# Patient Record
Sex: Female | Born: 1940 | Race: Black or African American | Hispanic: No | Smoking: Never smoker
Health system: Southern US, Community
[De-identification: ages and names within clinical notes are randomized; demographics above are authoritative.]

## PROBLEM LIST (undated history)

## (undated) DIAGNOSIS — I10 Essential (primary) hypertension: Secondary | ICD-10-CM

## (undated) HISTORY — PX: TOTAL HIP ARTHROPLASTY: SHX124

---

## 2006-11-19 ENCOUNTER — Emergency Department (HOSPITAL_COMMUNITY): Admission: EM | Admit: 2006-11-19 | Discharge: 2006-11-19 | Payer: Self-pay | Admitting: Emergency Medicine

## 2011-04-21 ENCOUNTER — Emergency Department (HOSPITAL_COMMUNITY): Payer: Medicare Other

## 2011-04-21 ENCOUNTER — Emergency Department (HOSPITAL_COMMUNITY)
Admission: EM | Admit: 2011-04-21 | Discharge: 2011-04-21 | Disposition: A | Discharge status: Home / Self Care | Payer: Medicare Other | Attending: Emergency Medicine | Admitting: Emergency Medicine

## 2011-04-21 DIAGNOSIS — M549 Dorsalgia, unspecified: Secondary | ICD-10-CM | POA: Insufficient documentation

## 2011-04-21 DIAGNOSIS — J209 Acute bronchitis, unspecified: Secondary | ICD-10-CM | POA: Insufficient documentation

## 2011-04-21 DIAGNOSIS — R059 Cough, unspecified: Secondary | ICD-10-CM | POA: Insufficient documentation

## 2011-04-21 DIAGNOSIS — R05 Cough: Secondary | ICD-10-CM | POA: Insufficient documentation

## 2011-04-21 DIAGNOSIS — I10 Essential (primary) hypertension: Secondary | ICD-10-CM | POA: Insufficient documentation

## 2011-04-21 LAB — BASIC METABOLIC PANEL
BUN: 16 mg/dL (ref 6–23)
CO2: 32 mEq/L (ref 19–32)
Chloride: 96 mEq/L (ref 96–112)
Creatinine, Ser: 1.44 mg/dL — ABNORMAL HIGH (ref 0.4–1.2)
GFR calc Af Amer: 43 mL/min — ABNORMAL LOW (ref >60)
GFR calc non Af Amer: 36 mL/min — ABNORMAL LOW (ref >60)
Potassium: 4.1 mEq/L (ref 3.5–5.1)

## 2011-04-21 LAB — POCT CARDIAC MARKERS: Myoglobin, poc: 272 ng/mL (ref 12–200)

## 2011-04-21 LAB — CBC
HCT: 39.9 % (ref 36.0–46.0)
MCH: 27.5 pg (ref 26.0–34.0)
WBC: 3.5 10*3/uL — ABNORMAL LOW (ref 4.0–10.5)

## 2011-04-21 LAB — DIFFERENTIAL
Basophils Absolute: 0 10*3/uL (ref 0.0–0.1)
Basophils Relative: 0 % (ref 0–1)
Eosinophils Relative: 1 % (ref 0–5)
Lymphs Abs: 0.7 10*3/uL (ref 0.7–4.0)
Monocytes Absolute: 0.4 10*3/uL (ref 0.1–1.0)
Monocytes Relative: 10 % (ref 3–12)
Neutro Abs: 2.3 10*3/uL (ref 1.7–7.7)

## 2012-01-10 IMAGING — CR DG CHEST 2V
2 series · 2 of 2 positions shown · non-contrast
Comparison: 11/19/2006

CLINICAL DATA: Cough, chest pain, left arm pain.

CHEST - 2 VIEW

[view not recorded (1 of 2)]
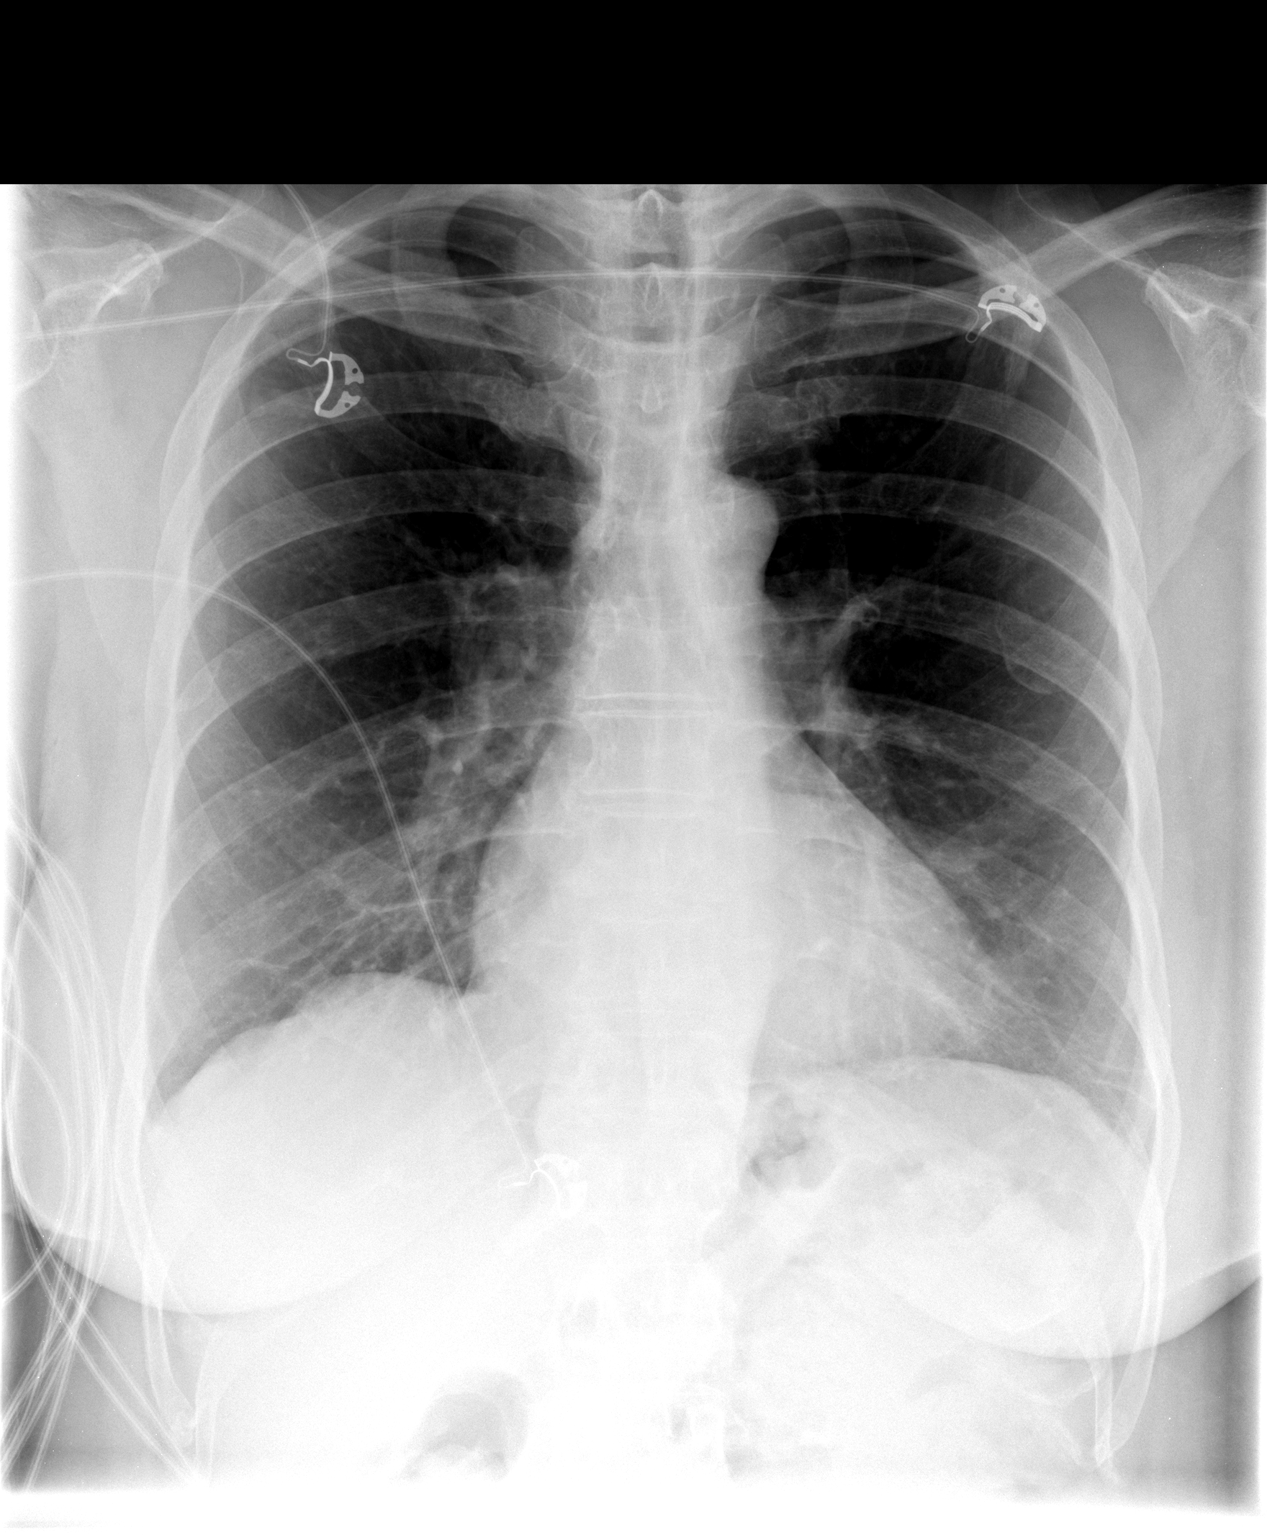

[view not recorded (2 of 2)]
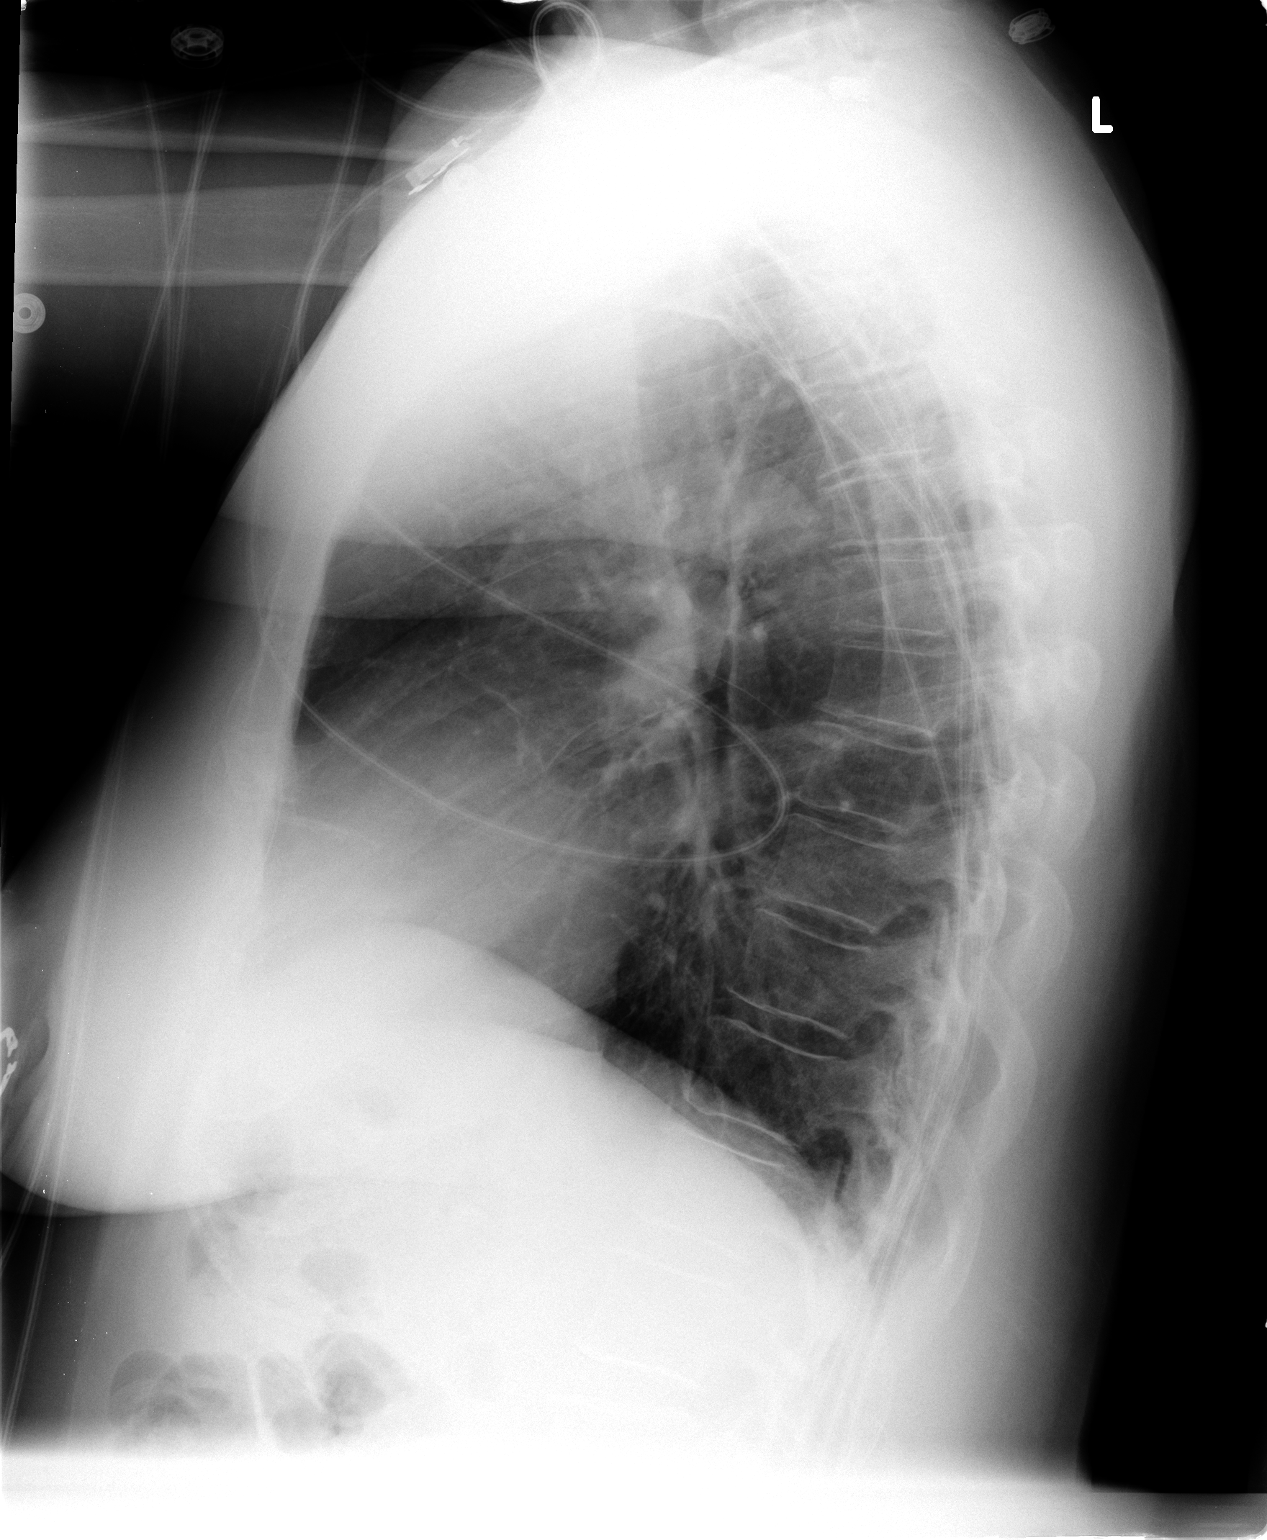

[2 of 2 positions shown; findings below may reference images not displayed]

FINDINGS: Linear densities in the lung bases, likely minimal
scarring.  Heart is upper limits normal in size.  No acute
opacities or effusions.  No acute bony abnormality.
IMPRESSION: No active disease.  Bibasilar scarring.

## 2013-03-30 ENCOUNTER — Emergency Department (HOSPITAL_COMMUNITY)
Admission: EM | Admit: 2013-03-30 | Discharge: 2013-03-30 | Disposition: A | Discharge status: Home / Self Care | Payer: Medicare Other | Attending: Emergency Medicine | Admitting: Emergency Medicine

## 2013-03-30 ENCOUNTER — Encounter (HOSPITAL_COMMUNITY): Payer: Self-pay

## 2013-03-30 DIAGNOSIS — Z9119 Patient's noncompliance with other medical treatment and regimen: Secondary | ICD-10-CM | POA: Insufficient documentation

## 2013-03-30 DIAGNOSIS — L089 Local infection of the skin and subcutaneous tissue, unspecified: Secondary | ICD-10-CM | POA: Insufficient documentation

## 2013-03-30 DIAGNOSIS — Z9114 Patient's other noncompliance with medication regimen: Secondary | ICD-10-CM

## 2013-03-30 DIAGNOSIS — Z79899 Other long term (current) drug therapy: Secondary | ICD-10-CM | POA: Insufficient documentation

## 2013-03-30 DIAGNOSIS — I10 Essential (primary) hypertension: Secondary | ICD-10-CM | POA: Insufficient documentation

## 2013-03-30 DIAGNOSIS — Y939 Activity, unspecified: Secondary | ICD-10-CM | POA: Insufficient documentation

## 2013-03-30 DIAGNOSIS — Z91199 Patient's noncompliance with other medical treatment and regimen due to unspecified reason: Secondary | ICD-10-CM | POA: Insufficient documentation

## 2013-03-30 DIAGNOSIS — Y929 Unspecified place or not applicable: Secondary | ICD-10-CM | POA: Insufficient documentation

## 2013-03-30 DIAGNOSIS — W57XXXA Bitten or stung by nonvenomous insect and other nonvenomous arthropods, initial encounter: Secondary | ICD-10-CM

## 2013-03-30 HISTORY — DX: Essential (primary) hypertension: I10

## 2013-03-30 LAB — BASIC METABOLIC PANEL
BUN: 12 mg/dL (ref 6–23)
Chloride: 102 mEq/L (ref 96–112)
GFR calc Af Amer: 74 mL/min — ABNORMAL LOW (ref >90)
GFR calc non Af Amer: 64 mL/min — ABNORMAL LOW (ref >90)
Glucose, Bld: 97 mg/dL (ref 70–99)
Potassium: 3.5 mEq/L (ref 3.5–5.1)
Sodium: 140 mEq/L (ref 135–145)

## 2013-03-30 MED ORDER — LISINOPRIL-HYDROCHLOROTHIAZIDE 20-25 MG PO TABS: 1.0000 | ORAL_TABLET | Freq: Every day | ORAL | Status: DC

## 2013-03-30 MED ORDER — CEPHALEXIN 500 MG PO CAPS: 500.0000 mg | ORAL_CAPSULE | Freq: Four times a day (QID) | ORAL | Status: DC

## 2013-03-30 NOTE — ED Provider Notes (Signed)
History     CSN: [id number]  Arrival date & time 03/30/13  4098   First MD Initiated Contact with Patient 03/30/13 0901      Chief Complaint  Patient presents with  . Insect Bite    (Consider location/radiation/quality/duration/timing/severity/associated sxs/prior treatment) HPI Comments: Tammy Nelson is a 72 y.o. Female who presents with an insect bite to her left upper arm present for the past 3-4 days which has been itchy,  But now developing soreness around and firmness around the site.  She denies fevers or chills and has had no nausea, vomiting,  Shortness of breath,  No red streaking and no pain in her axilla.  She has applied antibiotic ointment without relief.  There is no radiation of pain which is constant but worse with palpation.     The history is provided by the patient.    Past Medical History  Diagnosis Date  . Hypertension     History reviewed. No pertinent past surgical history.  No family history on file.  History  Substance Use Topics  . Smoking status: Never Smoker   . Smokeless tobacco: Not on file  . Alcohol Use: No    OB History   Grav Para Term Preterm Abortions TAB SAB Ect Mult Living                  Review of Systems  Constitutional: Negative for fever and chills.  HENT: Negative for facial swelling.   Respiratory: Negative for shortness of breath and wheezing.   Skin: Positive for color change and wound.  Neurological: Negative for numbness.    Allergies  Review of patient's allergies indicates no known allergies.  Home Medications   Current Outpatient Rx  Name  Route  Sig  Dispense  Refill  . lisinopril-hydrochlorothiazide (PRINZIDE,ZESTORETIC) 20-25 MG per tablet   Oral   Take 1 tablet by mouth daily.           BP 215/102  Pulse 82  Temp(Src) 98.1 F (36.7 C) (Oral)  Resp 18  Ht 5\' 8"  (1.727 m)  Wt 179 lb (81.194 kg)  BMI 27.22 kg/m2  SpO2 99%  Physical Exam  Constitutional: She appears well-developed  and well-nourished. No distress.  HENT:  Head: Normocephalic.  Neck: Neck supple.  Cardiovascular: Normal rate.   Pulmonary/Chest: Effort normal. She has no wheezes.  Musculoskeletal: Normal range of motion. She exhibits no edema.  Lymphadenopathy:       Left axillary: No pectoral and no lateral adenopathy present. Skin: Rash noted. Rash is papular. There is erythema.  Small erythematous papule left upper arm proximal to antecubital space.  TTP with slight surrounding erythema.  No drainage,  Fluctuance or induration surrounding the site.      ED Course  Procedures (including critical care time)  Labs Reviewed  BASIC METABOLIC PANEL   No results found.   1. Infected insect bite   2. Hypertension   3. H/O medication noncompliance       MDM  Possible early infected insect bite.  Will cover with keflex.  Advised benadryl for itch.  Also discussed elevated bp.  Patient used to take lisinopril/hctz - ran out when she moved back here from [place],  Has not established care with pcp here.  Takes her daughters bp med prn,  Last dose over a week ago.  Denies headache,  Sob, cp, visual changes, peripheral edema.    Patients labs and/or radiological studies were viewed and considered during the medical  decision making and disposition process.  Keflex, lisinopril/hctz prescribed.  Referrals for pcp given.          Burgess Amor, PA-C 03/30/13 1025

## 2013-03-30 NOTE — ED Notes (Signed)
Pt reports bug bite to left arm for past couple of days.  Reports tender to touch and itches.

## 2013-03-30 NOTE — ED Provider Notes (Signed)
This chart was scribed for Glynn Octave, MD by Shari Heritage, ED Scribe. The patient was seen in room APA11/APA11. Patient was evaluated by me at 0935.  History: Patient presents to the ED with a small erythematous raised area to the left arm. There is associated itching and soreness to the area. She states that she first noticed the last Friday. Patient admits to noncompliance with blood pressure medicines for the past year.  Exam: Small erythematous papule to left AC fossa. No cellulitis.  Check creatinine prior to restarting ACEI.  I personally performed the services described in this documentation, which was scribed in my presence. The recorded information has been reviewed and is accurate.   Glynn Octave, MD 03/30/13 [phone]

## 2013-03-30 NOTE — ED Notes (Signed)
Pt's bp elevated.  Pt says is supposed to be on bp medication but hasn't been back to her PCP to get prescription renewed.  Discussed with pt the dangers of high bp.

## 2013-03-30 NOTE — ED Provider Notes (Signed)
Medical screening examination/treatment/procedure(s) were conducted as a shared visit with non-physician practitioner(s) and myself.  I personally evaluated the patient during the encounter  See my additional note  Kala Gassmann, MD 03/30/13 1550 

## 2016-02-14 DIAGNOSIS — Z6831 Body mass index (BMI) 31.0-31.9, adult: Secondary | ICD-10-CM | POA: Diagnosis not present

## 2016-02-14 DIAGNOSIS — I10 Essential (primary) hypertension: Secondary | ICD-10-CM | POA: Diagnosis not present

## 2016-02-14 DIAGNOSIS — Z23 Encounter for immunization: Secondary | ICD-10-CM | POA: Diagnosis not present

## 2016-02-14 DIAGNOSIS — F411 Generalized anxiety disorder: Secondary | ICD-10-CM | POA: Diagnosis not present

## 2016-02-14 DIAGNOSIS — Z0000 Encounter for general adult medical examination without abnormal findings: Secondary | ICD-10-CM | POA: Diagnosis not present

## 2016-02-14 DIAGNOSIS — R739 Hyperglycemia, unspecified: Secondary | ICD-10-CM | POA: Diagnosis not present

## 2016-09-18 DIAGNOSIS — J209 Acute bronchitis, unspecified: Secondary | ICD-10-CM | POA: Diagnosis not present

## 2016-11-14 DIAGNOSIS — Z6833 Body mass index (BMI) 33.0-33.9, adult: Secondary | ICD-10-CM | POA: Diagnosis not present

## 2016-11-14 DIAGNOSIS — I10 Essential (primary) hypertension: Secondary | ICD-10-CM | POA: Diagnosis not present

## 2016-11-14 DIAGNOSIS — F411 Generalized anxiety disorder: Secondary | ICD-10-CM | POA: Diagnosis not present

## 2017-02-16 DIAGNOSIS — L03115 Cellulitis of right lower limb: Secondary | ICD-10-CM | POA: Diagnosis not present

## 2017-05-23 ENCOUNTER — Observation Stay (HOSPITAL_COMMUNITY)
Admission: EM | Admit: 2017-05-23 | Discharge: 2017-05-25 | Disposition: A | Discharge status: Home / Self Care | Payer: Medicare HMO | Attending: Internal Medicine | Admitting: Internal Medicine

## 2017-05-23 ENCOUNTER — Observation Stay (HOSPITAL_BASED_OUTPATIENT_CLINIC_OR_DEPARTMENT_OTHER): Payer: Medicare HMO

## 2017-05-23 ENCOUNTER — Encounter (HOSPITAL_COMMUNITY): Payer: Self-pay | Admitting: Emergency Medicine

## 2017-05-23 ENCOUNTER — Emergency Department (HOSPITAL_COMMUNITY): Payer: Medicare HMO

## 2017-05-23 DIAGNOSIS — I4891 Unspecified atrial fibrillation: Secondary | ICD-10-CM | POA: Diagnosis not present

## 2017-05-23 DIAGNOSIS — Z79899 Other long term (current) drug therapy: Secondary | ICD-10-CM | POA: Insufficient documentation

## 2017-05-23 DIAGNOSIS — M79606 Pain in leg, unspecified: Secondary | ICD-10-CM

## 2017-05-23 DIAGNOSIS — R739 Hyperglycemia, unspecified: Secondary | ICD-10-CM | POA: Diagnosis not present

## 2017-05-23 DIAGNOSIS — I10 Essential (primary) hypertension: Secondary | ICD-10-CM | POA: Diagnosis not present

## 2017-05-23 DIAGNOSIS — E876 Hypokalemia: Secondary | ICD-10-CM

## 2017-05-23 DIAGNOSIS — I340 Nonrheumatic mitral (valve) insufficiency: Secondary | ICD-10-CM | POA: Diagnosis not present

## 2017-05-23 DIAGNOSIS — R079 Chest pain, unspecified: Secondary | ICD-10-CM | POA: Diagnosis present

## 2017-05-23 LAB — ECHOCARDIOGRAM COMPLETE
HEIGHTINCHES: 68.5 in
WEIGHTICAEL: 3372.8 [oz_av]

## 2017-05-23 LAB — CBC
HEMATOCRIT: 35.8 % — AB (ref 36.0–46.0)
HEMOGLOBIN: 11.2 g/dL — AB (ref 12.0–15.0)
MCH: 27.7 pg (ref 26.0–34.0)
MCHC: 31.3 g/dL (ref 30.0–36.0)
MCV: 88.4 fL (ref 78.0–100.0)
Platelets: 189 10*3/uL (ref 150–400)
RBC: 4.05 MIL/uL (ref 3.87–5.11)
RDW: 13.8 % (ref 11.5–15.5)
WBC: 6.2 10*3/uL (ref 4.0–10.5)

## 2017-05-23 LAB — BASIC METABOLIC PANEL
ANION GAP: 10 (ref 5–15)
BUN: 12 mg/dL (ref 6–20)
CALCIUM: 9.1 mg/dL (ref 8.9–10.3)
CO2: 27 mmol/L (ref 22–32)
Chloride: 99 mmol/L — ABNORMAL LOW (ref 101–111)
Creatinine, Ser: 1.09 mg/dL — ABNORMAL HIGH (ref 0.44–1.00)
GFR, EST AFRICAN AMERICAN: 55 mL/min — AB (ref >60)
GFR, EST NON AFRICAN AMERICAN: 48 mL/min — AB (ref >60)
GLUCOSE: 169 mg/dL — AB (ref 65–99)
POTASSIUM: 3.3 mmol/L — AB (ref 3.5–5.1)
Sodium: 136 mmol/L (ref 135–145)

## 2017-05-23 LAB — TROPONIN I: Troponin I: <0.03 ng/mL (ref <0.03)

## 2017-05-23 LAB — MAGNESIUM: MAGNESIUM: 1.8 mg/dL (ref 1.7–2.4)

## 2017-05-23 LAB — TSH: TSH: 1.955 u[IU]/mL (ref 0.350–4.500)

## 2017-05-23 MED ORDER — POTASSIUM CHLORIDE CRYS ER 20 MEQ PO TBCR
40.0000 meq | EXTENDED_RELEASE_TABLET | Freq: Once | ORAL | Status: AC
  Administered 2017-05-23: 40 meq via ORAL
  Filled 2017-05-23: qty 2

## 2017-05-23 MED ORDER — NITROGLYCERIN 0.4 MG SL SUBL
0.4000 mg | SUBLINGUAL_TABLET | SUBLINGUAL | Status: DC | PRN
  Administered 2017-05-23: 0.4 mg via SUBLINGUAL
  Filled 2017-05-23: qty 1

## 2017-05-23 MED ORDER — ONDANSETRON HCL 4 MG PO TABS: 4.0000 mg | ORAL_TABLET | Freq: Four times a day (QID) | ORAL | Status: DC | PRN

## 2017-05-23 MED ORDER — METOPROLOL TARTRATE 25 MG PO TABS
12.5000 mg | ORAL_TABLET | Freq: Two times a day (BID) | ORAL | Status: DC
  Administered 2017-05-23: 12.5 mg via ORAL
  Filled 2017-05-23 (×3): qty 1

## 2017-05-23 MED ORDER — ACETAMINOPHEN 325 MG PO TABS
650.0000 mg | ORAL_TABLET | Freq: Four times a day (QID) | ORAL | Status: DC | PRN
  Administered 2017-05-23 – 2017-05-24 (×2): 650 mg via ORAL
  Filled 2017-05-23 (×2): qty 2

## 2017-05-23 MED ORDER — POTASSIUM CHLORIDE CRYS ER 20 MEQ PO TBCR
20.0000 meq | EXTENDED_RELEASE_TABLET | Freq: Once | ORAL | Status: AC
  Administered 2017-05-23: 20 meq via ORAL
  Filled 2017-05-23: qty 1

## 2017-05-23 MED ORDER — ACETAMINOPHEN 650 MG RE SUPP: 650.0000 mg | Freq: Four times a day (QID) | RECTAL | Status: DC | PRN

## 2017-05-23 MED ORDER — ASPIRIN 81 MG PO CHEW
324.0000 mg | CHEWABLE_TABLET | Freq: Once | ORAL | Status: AC
  Administered 2017-05-23: 324 mg via ORAL
  Filled 2017-05-23: qty 4

## 2017-05-23 MED ORDER — ACETAMINOPHEN 325 MG PO TABS
650.0000 mg | ORAL_TABLET | Freq: Once | ORAL | Status: AC
  Administered 2017-05-23: 650 mg via ORAL
  Filled 2017-05-23: qty 2

## 2017-05-23 MED ORDER — APIXABAN 5 MG PO TABS
5.0000 mg | ORAL_TABLET | Freq: Two times a day (BID) | ORAL | Status: DC
  Administered 2017-05-23 – 2017-05-25 (×5): 5 mg via ORAL
  Filled 2017-05-23 (×5): qty 1

## 2017-05-23 MED ORDER — GI COCKTAIL ~~LOC~~
30.0000 mL | Freq: Once | ORAL | Status: AC
  Administered 2017-05-23: 30 mL via ORAL
  Filled 2017-05-23: qty 30

## 2017-05-23 MED ORDER — ONDANSETRON HCL 4 MG/2ML IJ SOLN: 4.0000 mg | Freq: Four times a day (QID) | INTRAMUSCULAR | Status: DC | PRN

## 2017-05-23 NOTE — Progress Notes (Signed)
ANTICOAGULATION CONSULT NOTE - Initial Consult  Pharmacy Consult for Apixaban Indication: atrial fibrillation  No Known Allergies  Patient Measurements: Height: 5\' 8"  (172.7 cm) Weight: 180 lb (81.6 kg) IBW/kg (Calculated) : 63.9 Heparin Dosing Weight:   Vital Signs: Temp: 98.2 F (36.8 C) (06/09 1147) Temp Source: Oral (06/09 1147) BP: 109/78 (06/09 1301) Pulse Rate: 95 (06/09 1245)  Labs:  Recent Labs  05/23/17 1143  HGB 11.2*  HCT 35.8*  PLT 189  CREATININE 1.09*  TROPONINI <0.03    Estimated Creatinine Clearance: 48.4 mL/min (A) (by C-G formula based on SCr of 1.09 mg/dL (H)).   Medical History: Past Medical History:  Diagnosis Date  . Hypertension     Medications:   (Not in a hospital admission)  Assessment: New onset atrial fibrillation Labs reviewed Age < 80 , weight > 60 kg, SCR 1.09 Goal of Therapy:  Nonvalvular atrial fibrillation dosing Monitor platelets by anticoagulation protocol: Yes   Plan:  Apixaban 5 mg po twice daily Monitor CBC, platelets Monitor signs of bleeding   Raquel JamesPittman, Kerrigan Glendening Bennett 05/23/2017,1:53 PM

## 2017-05-23 NOTE — ED Notes (Signed)
Pt not wanting any more nitro at this time.

## 2017-05-23 NOTE — ED Provider Notes (Signed)
[facility] Provider Note   CSN: [id number] Arrival date & time: 05/23/17  1135     History   Chief Complaint Chief Complaint  Patient presents with  . Chest Pain    HPI Tammy Nelson is a 76 y.o. female.   Chest Pain    Pt was seen at 1210. Per pt, c/o gradual onset and persistence of waxing and waning chest "pain" for the past 2 days, worse this morning. Describes the CP "sharp" with radiation into her neck. States the CP worsened on exertion this morning, then she "rubbed some pain cream" on her chest and sat with a hot water bottle "until it eased up." Denies any other symptoms. Denies palpitations, no SOB/cough, no abd pain, no N/V/D, no back pain, no fevers, no rash.   Past Medical History:  Diagnosis Date  . Hypertension     There are no active problems to display for this patient.   Past Surgical History:  Procedure Laterality Date  . TOTAL HIP ARTHROPLASTY Right     OB History    No data available       Home Medications    Prior to Admission medications   Medication Sig Start Date End Date Taking? Authorizing Provider  cephALEXin (KEFLEX) 500 MG capsule Take 1 capsule (500 mg total) by mouth 4 (four) times daily. 03/30/13   Burgess Amor, PA-C  lisinopril-hydrochlorothiazide (PRINZIDE,ZESTORETIC) 20-25 MG per tablet Take 1 tablet by mouth daily.    [provider]  lisinopril-hydrochlorothiazide (PRINZIDE,ZESTORETIC) 20-25 MG per tablet Take 1 tablet by mouth daily. 03/30/13   Burgess Amor, PA-C    Family History No family history on file.  Social History Social History  Substance Use Topics  . Smoking status: Never Smoker  . Smokeless tobacco: Never Used  . Alcohol use No     Allergies   Patient has no known allergies.   Review of Systems Review of Systems  Cardiovascular: Positive for chest pain.  ROS: Statement: All systems negative except as marked or noted in the HPI; Constitutional: Negative for fever and chills. ;  ; Eyes: Negative for eye pain, redness and discharge. ; ; ENMT: Negative for ear pain, hoarseness, nasal congestion, sinus pressure and sore throat. ; ; Cardiovascular: +CP. Negative for palpitations, diaphoresis, dyspnea and peripheral edema. ; ; Respiratory: Negative for cough, wheezing and stridor. ; ; Gastrointestinal: Negative for nausea, vomiting, diarrhea, abdominal pain, blood in stool, hematemesis, jaundice and rectal bleeding. . ; ; Genitourinary: Negative for dysuria, flank pain and hematuria. ; ; Musculoskeletal: Negative for back pain and neck pain. Negative for swelling and trauma.; ; Skin: Negative for pruritus, rash, abrasions, blisters, bruising and skin lesion.; ; Neuro: Negative for headache, lightheadedness and neck stiffness. Negative for weakness, altered level of consciousness, altered mental status, extremity weakness, paresthesias, involuntary movement, seizure and syncope.      Physical Exam Updated Vital Signs BP 132/75 (BP Location: Right Arm)   Pulse 97   Temp 98.2 F (36.8 C) (Oral)   Resp 16   Ht 5\' 8"  (1.727 m)   Wt 81.6 kg (180 lb)   SpO2 99%   BMI 27.37 kg/m   Physical Exam 1215: Physical examination:  Nursing notes reviewed; Vital signs and O2 SAT reviewed;  Constitutional: Well developed, Well nourished, Well hydrated, In no acute distress; Head:  Normocephalic, atraumatic; Eyes: EOMI, PERRL, No scleral icterus; ENMT: Mouth and pharynx normal, Mucous membranes moist; Neck: Supple, Full range of motion, No lymphadenopathy; Cardiovascular:  Tachycardic rate and rhythm, No gallop; Respiratory: Breath sounds clear & equal bilaterally, No wheezes.  Speaking full sentences with ease, Normal respiratory effort/excursion; Chest: Nontender, Movement normal; Abdomen: Soft, Nontender, Nondistended, Normal bowel sounds; Genitourinary: No CVA tenderness; Extremities: Pulses normal, No tenderness, +1 pedal edema bilat. No calf edema or asymmetry.; Neuro: AA&Ox3, vague  historian. Major CN grossly intact.  Speech clear. No gross focal motor or sensory deficits in extremities.; Skin: Color normal, Warm, Dry.   ED Treatments / Results  Labs (all labs ordered are listed, but only abnormal results are displayed)   EKG  EKG Interpretation  Date/Time:  Saturday May 23 2017 11:44:24 EDT Ventricular Rate:  117 PR Interval:    QRS Duration: 105 QT Interval:  372 QTC Calculation: 519 R Axis:   67 Text Interpretation:  Atrial fibrillation Low voltage, precordial leads Prolonged QT interval Baseline wander Artifact When compared with ECG of 04/21/2011 Atrial fibrillation is now Present Confirmed by [facility]  MD, Nicholos Johns [phone]) on 05/23/2017 12:18:45 PM        EKG Interpretation  Date/Time:  Saturday May 23 2017 12:21:35 EDT Ventricular Rate:  88 PR Interval:    QRS Duration: 102 QT Interval:  361 QTC Calculation: 437 R Axis:   35 Text Interpretation:  Atrial fibrillation Low voltage, precordial leads Borderline T abnormalities, diffuse leads Since last tracing of earlier today Rate slower Confirmed by [facility]  MD, Nicholos Johns [phone]) on 05/23/2017 12:24:39 PM        Radiology   Procedures Procedures (including critical care time)  Medications Ordered in ED Medications  gi cocktail (Maalox,Lidocaine,Donnatal) (not administered)  acetaminophen (TYLENOL) tablet 650 mg (not administered)     Initial Impression / Assessment and Plan / ED Course  I have reviewed the triage vital signs and the nursing notes.  Pertinent labs & imaging results that were available during my care of the patient were reviewed by me and considered in my medical decision making (see chart for details).  MDM Reviewed: previous chart, nursing note and vitals Reviewed previous: labs and ECG Interpretation: labs, ECG and x-ray   Results for orders placed or performed during the hospital encounter of 05/23/17  Basic metabolic panel  Result Value Ref Range   Sodium 136  135 - 145 mmol/L   Potassium 3.3 (L) 3.5 - 5.1 mmol/L   Chloride 99 (L) 101 - 111 mmol/L   CO2 27 22 - 32 mmol/L   Glucose, Bld 169 (H) 65 - 99 mg/dL   BUN 12 6 - 20 mg/dL   Creatinine, Ser 8.46 (H) 0.44 - 1.00 mg/dL   Calcium 9.1 8.9 - 96.2 mg/dL   GFR calc non Af Amer 48 (L) >60 mL/min   GFR calc Af Amer 55 (L) >60 mL/min   Anion gap 10 5 - 15  CBC  Result Value Ref Range   WBC 6.2 4.0 - 10.5 K/uL   RBC 4.05 3.87 - 5.11 MIL/uL   Hemoglobin 11.2 (L) 12.0 - 15.0 g/dL   HCT 95.2 (L) 84.1 - 32.4 %   MCV 88.4 78.0 - 100.0 fL   MCH 27.7 26.0 - 34.0 pg   MCHC 31.3 30.0 - 36.0 g/dL   RDW 40.1 02.7 - 25.3 %   Platelets 189 150 - 400 K/uL  Troponin I  Result Value Ref Range   Troponin I <0.03 <0.03 ng/mL  Magnesium  Result Value Ref Range   Magnesium 1.8 1.7 - 2.4 mg/dL   Dg Chest 2 View Result  Date: 05/23/2017 CLINICAL DATA:  Chest pain EXAM: CHEST  2 VIEW COMPARISON:  04/21/2011 FINDINGS: The heart is moderately enlarged. Normal vascularity. Bibasilar atelectasis. Low volumes. No pneumothorax. No pleural effusion. IMPRESSION: Cardiomegaly.  Bibasilar atelectasis. Electronically Signed   By: Jolaine ClickArthur  Hoss M.D.   On: 05/23/2017 12:19    This patients CHA2DS2-VASc Score and unadjusted Ischemic Stroke Rate (% per year) is equal to 4.8 % stroke rate/year from a score of 4  Above score calculated as 1 point each if present [CHF, HTN, DM, Vascular=MI/PAD/Aortic Plaque, Age if 65-74, or Female] Above score calculated as 2 points each if present [Age > 75, or Stroke/TIA/TE]   1240:  Pt is very vague confusing historian. Initially stated CP was constant, but then waxing and waning. APAP, GI cocktail and ASA given. Pt initially stated the meds did not change her CP, but then stated she "was better," then stated she still had CP. Will dose SL ntg and replete potassium PO. Afib on EKG is new since previous EKG. Given pt's vague HPI and new EKG changes, will observation admit. Dx d/w pt and family.   Questions answered.  Verb understanding, agreeable to admit. T/C to [facility]. Arbutus Leasat, case discussed, including:  HPI, pertinent PM/SHx, VS/PE, dx testing, ED course and treatment:  Agreeable to admit.   1300: Pt told ED RN CP decreased after Sl ntg.    Final Clinical Impressions(s) / ED Diagnoses   Final diagnoses:  None    New Prescriptions New Prescriptions   No medications on file      Samuel JesterMcManus, Abrie Egloff, DO 05/27/17 [id number]

## 2017-05-23 NOTE — Progress Notes (Signed)
*  PRELIMINARY RESULTS* Echocardiogram 2D Echocardiogram has been performed.  Stacey DrainWhite, Ariona Deschene J 05/23/2017, 4:58 PM

## 2017-05-23 NOTE — ED Notes (Signed)
EKG given to Dr. McManus.  

## 2017-05-23 NOTE — H&P (Signed)
History and Physical  Tammy Nelson ZOX:[id number] DOB: Mar 22, 1941 DOA: 05/23/2017   PCP: Carylon PerchesFagan, Roy, MD   Patient coming from: Home  Chief Complaint: chest pain  HPI:  Tammy Nelson is a 76 y.o. female with medical history of hypertension presents with 3 day history of substernal chest pain radiating to the neck. The patient states that it has been essentially constant for the past 3 days. It is occasionally sharp in nature, but she mostly describes it as a pressure-like sensation. She has some associated shortness of breath but denies any nausea, vomiting, diaphoresis, dizziness, syncope, palpitations. She states that certain movements particularly getting up and turning to her side makes the chest discomfort worse. She denies any recent travels or long trips. She denies any coughing, hemoptysis, fevers, chills.  She denies any abdominal pain, dysuria, hematuria, headache, neck pain, rashes.  In the emergency room, the patient was afebrile hematoma, stable saturating 99% on room air. She was noted to be in atrial fibrillation with heart rate 100-110.  Initial troponin was neg. Potassium was 3.3 with otherwise unremarkable BMP.  Magnesium was 1.8. CBC was unremarkable. Hemoglobin was 11 2. She has a baseline hemoglobin of 11-12. EKG showed atrial fibrillation with nonspecific T-wave changes. Chest x-ray showed bibasilar atelectasis. The patient was given 40 mEq of potassium. The patient was given sublingual nitroglycerin which seemed to help her chest discomfort. Assessment/Plan:  Atypical chest pain -partly reproducible on exam -finish cycling troponins -personally reviewed EKG--Afib with nonspecific T wave changes -05/23/17 CXR--personally reviewed-- bibiasilar atelectasis -check A1C and lipid panel  Newly diagnosed Atrial Fibrillation -CHADSVASc = 4 (HTN, Age, female) -start apixaban -start metoprolol tartrate -TSH -Echo  Essential Hypertension -holding lisinopril/hctz  while starting metroprolol  Lower extremity edema and pain -venous duplex r/o DVT  Hyperglycemia -check A1C  Hypokalemia -check mag--1.8 -replete--give additional 20 mEq -am BMP          Past Medical History:  Diagnosis Date  . Hypertension    Past Surgical History:  Procedure Laterality Date  . TOTAL HIP ARTHROPLASTY Right    Social History:  reports that she has never smoked. She has never used smokeless tobacco. She reports that she does not drink alcohol or use drugs.   Family history--family history reviewed--no premature CAD  No Known Allergies   Prior to Admission medications   Medication Sig Start Date End Date Taking? Authorizing Provider  cephALEXin (KEFLEX) 500 MG capsule Take 1 capsule (500 mg total) by mouth 4 (four) times daily. 03/30/13   Burgess AmorIdol, Julie, PA-C  lisinopril-hydrochlorothiazide (PRINZIDE,ZESTORETIC) 20-25 MG per tablet Take 1 tablet by mouth daily.    [provider]  lisinopril-hydrochlorothiazide (PRINZIDE,ZESTORETIC) 20-25 MG per tablet Take 1 tablet by mouth daily. 03/30/13   Burgess AmorIdol, Julie, PA-C    Review of Systems:  Constitutional:  No weight loss, night sweats, Fevers, chills, fatigue.  Head&Eyes: No headache.  No vision loss.  No eye pain or scotoma ENT:  No Difficulty swallowing,Tooth/dental problems,Sore throat,  No ear ache, post nasal drip,  Cardio-vascular:  No  Orthopnea, PND, swelling in lower extremities,  dizziness, palpitations  GI:  No  abdominal pain, nausea, vomiting, diarrhea, loss of appetite, hematochezia, melena, heartburn, indigestion, Resp:   No cough. No coughing up of blood .No wheezing.No chest wall deformity  Skin:  no rash or lesions.  GU:  no dysuria, change in color of urine, no urgency or frequency. No flank pain.  Musculoskeletal:  No joint pain  or swelling. No decreased range of motion. No back pain.  Psych:  No change in mood or affect. No depression or anxiety. Neurologic: No  headache, no dysesthesia, no focal weakness, no vision loss. No syncope  Physical Exam: Vitals:   05/23/17 1235 05/23/17 1245 05/23/17 1300 05/23/17 1301  BP: 118/76  109/78 109/78  Pulse:  95    Resp:  16 (!) 21   Temp:      TempSrc:      SpO2:  93%    Weight:      Height:       General:  A&O x 3, NAD, nontoxic, pleasant/cooperative Head/Eye: No conjunctival hemorrhage, no icterus, [place]/AT, No nystagmus ENT:  No icterus,  No thrush, good dentition, no pharyngeal exudate Neck:  No masses, no lymphadenpathy, no bruits CV:  RRR, no rub, no gallop, no S3, no JVD Lung:  Bibasilar crackles.  good air movement, no wheeze, no rhonchi Abdomen: soft/NT, +BS, nondistended, no peritoneal signs Ext: No cyanosis, No rashes, No petechiae, No lymphangitis, 1+ LE edema Neuro: CNII-XII intact, strength 4/5 in bilateral upper and lower extremities, no dysmetria  Labs on Admission:  Basic Metabolic Panel:  Recent Labs Lab 05/23/17 1143  NA 136  K 3.3*  CL 99*  CO2 27  GLUCOSE 169*  BUN 12  CREATININE 1.09*  CALCIUM 9.1  MG 1.8   Liver Function Tests: No results for input(s): AST, ALT, ALKPHOS, BILITOT, PROT, ALBUMIN in the last 168 hours. No results for input(s): LIPASE, AMYLASE in the last 168 hours. No results for input(s): AMMONIA in the last 168 hours. CBC:  Recent Labs Lab 05/23/17 1143  WBC 6.2  HGB 11.2*  HCT 35.8*  MCV 88.4  PLT 189   Coagulation Profile: No results for input(s): INR, PROTIME in the last 168 hours. Cardiac Enzymes:  Recent Labs Lab 05/23/17 1143  TROPONINI <0.03   BNP: Invalid input(s): POCBNP CBG: No results for input(s): GLUCAP in the last 168 hours. Urine analysis: No results found for: COLORURINE, APPEARANCEUR, LABSPEC, PHURINE, GLUCOSEU, HGBUR, BILIRUBINUR, KETONESUR, PROTEINUR, UROBILINOGEN, NITRITE, LEUKOCYTESUR Sepsis Labs: @LABRCNTIP (procalcitonin:4,lacticidven:4) )No results found for this or any previous visit (from the past 240  hour(s)).   Radiological Exams on Admission: Dg Chest 2 View  Result Date: 05/23/2017 CLINICAL DATA:  Chest pain EXAM: CHEST  2 VIEW COMPARISON:  04/21/2011 FINDINGS: The heart is moderately enlarged. Normal vascularity. Bibasilar atelectasis. Low volumes. No pneumothorax. No pleural effusion. IMPRESSION: Cardiomegaly.  Bibasilar atelectasis. Electronically Signed   By: Jolaine Click M.D.   On: 05/23/2017 12:19    EKG: Independently reviewed. Atrial fibrillation, nonspecific T-wave changes    Time spent:60 minutes Code Status:   FULL Family Communication:  No Family at bedside Disposition Plan: expect 1-2 day hospitalization Consults called: none DVT Prophylaxis: apixaban  Brita Jurgensen, DO  [facility] Pager [phone]  If 7PM-7AM, please contact night-coverage [email or web address] Password [email or web address] 05/23/2017, 1:11 PM

## 2017-05-23 NOTE — ED Notes (Signed)
EKG given to Dr. Miller.  

## 2017-05-24 ENCOUNTER — Observation Stay (HOSPITAL_COMMUNITY): Payer: Medicare HMO

## 2017-05-24 DIAGNOSIS — I10 Essential (primary) hypertension: Secondary | ICD-10-CM | POA: Diagnosis not present

## 2017-05-24 DIAGNOSIS — I4891 Unspecified atrial fibrillation: Secondary | ICD-10-CM | POA: Diagnosis not present

## 2017-05-24 DIAGNOSIS — R079 Chest pain, unspecified: Secondary | ICD-10-CM | POA: Diagnosis not present

## 2017-05-24 LAB — CBC
HCT: 32.9 % — ABNORMAL LOW (ref 36.0–46.0)
Hemoglobin: 10.3 g/dL — ABNORMAL LOW (ref 12.0–15.0)
MCH: 27.6 pg (ref 26.0–34.0)
MCHC: 31.3 g/dL (ref 30.0–36.0)
MCV: 88.2 fL (ref 78.0–100.0)
PLATELETS: 174 10*3/uL (ref 150–400)
RBC: 3.73 MIL/uL — AB (ref 3.87–5.11)
RDW: 13.7 % (ref 11.5–15.5)
WBC: 3.6 10*3/uL — AB (ref 4.0–10.5)

## 2017-05-24 LAB — BASIC METABOLIC PANEL
ANION GAP: 9 (ref 5–15)
BUN: 16 mg/dL (ref 6–20)
CHLORIDE: 100 mmol/L — AB (ref 101–111)
CO2: 28 mmol/L (ref 22–32)
Calcium: 8.8 mg/dL — ABNORMAL LOW (ref 8.9–10.3)
Creatinine, Ser: 1 mg/dL (ref 0.44–1.00)
GFR calc non Af Amer: 53 mL/min — ABNORMAL LOW (ref >60)
Glucose, Bld: 106 mg/dL — ABNORMAL HIGH (ref 65–99)
POTASSIUM: 4.1 mmol/L (ref 3.5–5.1)
SODIUM: 137 mmol/L (ref 135–145)

## 2017-05-24 LAB — TROPONIN I: Troponin I: <0.03 ng/mL (ref <0.03)

## 2017-05-24 LAB — LIPID PANEL
CHOL/HDL RATIO: 3.3 ratio
CHOLESTEROL: 130 mg/dL (ref 0–200)
HDL: 40 mg/dL — AB (ref >40)
LDL CALC: 82 mg/dL (ref 0–99)
TRIGLYCERIDES: 42 mg/dL (ref <150)
VLDL: 8 mg/dL (ref 0–40)

## 2017-05-24 LAB — HEMOGLOBIN A1C
Hgb A1c MFr Bld: 5.6 % (ref 4.8–5.6)
Mean Plasma Glucose: 114 mg/dL

## 2017-05-24 LAB — MAGNESIUM: Magnesium: 2 mg/dL (ref 1.7–2.4)

## 2017-05-24 NOTE — Progress Notes (Signed)
Subjective: This is a 76 years old female with history of multiple medical illness was admitted due to chest pain. Her troponin was negative. EKG showed atrial fibrillation with RVR. Patient was started on Betablocker and anticoagulation. She feels better today.  Objective: Vital signs in last 24 hours: Temp:  [98.2 F (36.8 C)-99.1 F (37.3 C)] 98.8 F (37.1 C) (06/10 0537) Pulse Rate:  [77-99] 99 (06/10 1002) Resp:  [15-21] 15 (06/10 0537) BP: (109-132)/(58-78) 116/69 (06/10 1002) SpO2:  [93 %-100 %] 97 % (06/10 0537) Weight:  [81.6 kg (180 lb)-95.6 kg (210 lb 12.8 oz)] 95.6 kg (210 lb 12.8 oz) (06/09 1420) Weight change:  Last BM Date: 05/22/17  Intake/Output from previous day: 06/09 0701 - 06/10 0700 In: 240 [P.O.:240] Out: -   PHYSICAL EXAM General appearance: alert and no distress Resp: clear to auscultation bilaterally Cardio: regularly irregular rhythm GI: soft, non-tender; bowel sounds normal; no masses,  no organomegaly Extremities: extremities normal, atraumatic, no cyanosis or edema  Lab Results:  Results for orders placed or performed during the hospital encounter of 05/23/17 (from the past 48 hour(s))  Basic metabolic panel     Status: Abnormal   Collection Time: 05/23/17 11:43 AM  Result Value Ref Range   Sodium 136 135 - 145 mmol/L   Potassium 3.3 (L) 3.5 - 5.1 mmol/L   Chloride 99 (L) 101 - 111 mmol/L   CO2 27 22 - 32 mmol/L   Glucose, Bld 169 (H) 65 - 99 mg/dL   BUN 12 6 - 20 mg/dL   Creatinine, Ser 1.09 (H) 0.44 - 1.00 mg/dL   Calcium 9.1 8.9 - 10.3 mg/dL   GFR calc non Af Amer 48 (L) >60 mL/min   GFR calc Af Amer 55 (L) >60 mL/min    Comment: (NOTE) The eGFR has been calculated using the CKD EPI equation. This calculation has not been validated in all clinical situations. eGFR's persistently <60 mL/min signify possible Chronic Kidney Disease.    Anion gap 10 5 - 15  CBC     Status: Abnormal   Collection Time: 05/23/17 11:43 AM  Result Value  Ref Range   WBC 6.2 4.0 - 10.5 K/uL   RBC 4.05 3.87 - 5.11 MIL/uL   Hemoglobin 11.2 (L) 12.0 - 15.0 g/dL   HCT 35.8 (L) 36.0 - 46.0 %   MCV 88.4 78.0 - 100.0 fL   MCH 27.7 26.0 - 34.0 pg   MCHC 31.3 30.0 - 36.0 g/dL   RDW 13.8 11.5 - 15.5 %   Platelets 189 150 - 400 K/uL  Troponin I     Status: None   Collection Time: 05/23/17 11:43 AM  Result Value Ref Range   Troponin I <0.03 <0.03 ng/mL  Magnesium     Status: None   Collection Time: 05/23/17 11:43 AM  Result Value Ref Range   Magnesium 1.8 1.7 - 2.4 mg/dL  Hemoglobin A1c     Status: None   Collection Time: 05/23/17 11:43 AM  Result Value Ref Range   Hgb A1c MFr Bld 5.6 4.8 - 5.6 %    Comment: (NOTE)         Pre-diabetes: 5.7 - 6.4         Diabetes: >6.4         Glycemic control for adults with diabetes: <7.0    Mean Plasma Glucose 114 mg/dL    Comment: (NOTE) Performed At: [facility] [place], [place] [id number] Lindon Romp MD  FT:[id number]   TSH     Status: None   Collection Time: 05/23/17 11:43 AM  Result Value Ref Range   TSH 1.955 0.350 - 4.500 uIU/mL    Comment: Performed by a 3rd Generation assay with a functional sensitivity of <=0.01 uIU/mL.  Troponin I     Status: None   Collection Time: 05/23/17  4:43 PM  Result Value Ref Range   Troponin I <0.03 <0.03 ng/mL  Troponin I     Status: None   Collection Time: 05/23/17 11:38 PM  Result Value Ref Range   Troponin I <0.03 <0.03 ng/mL  Basic metabolic panel     Status: Abnormal   Collection Time: 05/24/17  6:40 AM  Result Value Ref Range   Sodium 137 135 - 145 mmol/L   Potassium 4.1 3.5 - 5.1 mmol/L    Comment: DELTA CHECK NOTED   Chloride 100 (L) 101 - 111 mmol/L   CO2 28 22 - 32 mmol/L   Glucose, Bld 106 (H) 65 - 99 mg/dL   BUN 16 6 - 20 mg/dL   Creatinine, Ser 1.00 0.44 - 1.00 mg/dL   Calcium 8.8 (L) 8.9 - 10.3 mg/dL   GFR calc non Af Amer 53 (L) >60 mL/min   GFR calc Af Amer >60 >60 mL/min    Comment: (NOTE) The eGFR  has been calculated using the CKD EPI equation. This calculation has not been validated in all clinical situations. eGFR's persistently <60 mL/min signify possible Chronic Kidney Disease.    Anion gap 9 5 - 15  Lipid panel     Status: Abnormal   Collection Time: 05/24/17  6:40 AM  Result Value Ref Range   Cholesterol 130 0 - 200 mg/dL   Triglycerides 42 <150 mg/dL   HDL 40 (L) >40 mg/dL   Total CHOL/HDL Ratio 3.3 RATIO   VLDL 8 0 - 40 mg/dL   LDL Cholesterol 82 0 - 99 mg/dL    Comment:        Total Cholesterol/HDL:CHD Risk Coronary Heart Disease Risk Table                     Men   Women  1/2 Average Risk   3.4   3.3  Average Risk       5.0   4.4  2 X Average Risk   9.6   7.1  3 X Average Risk  23.4   11.0        Use the calculated Patient Ratio above and the CHD Risk Table to determine the patient's CHD Risk.        ATP III CLASSIFICATION (LDL):  <100     mg/dL   Optimal  100-129  mg/dL   Near or Above                    Optimal  130-159  mg/dL   Borderline  160-189  mg/dL   High  >190     mg/dL   Very High   Magnesium     Status: None   Collection Time: 05/24/17  6:40 AM  Result Value Ref Range   Magnesium 2.0 1.7 - 2.4 mg/dL  CBC     Status: Abnormal   Collection Time: 05/24/17  6:40 AM  Result Value Ref Range   WBC 3.6 (L) 4.0 - 10.5 K/uL   RBC 3.73 (L) 3.87 - 5.11 MIL/uL   Hemoglobin 10.3 (L) 12.0 - 15.0 g/dL  HCT 32.9 (L) 36.0 - 46.0 %   MCV 88.2 78.0 - 100.0 fL   MCH 27.6 26.0 - 34.0 pg   MCHC 31.3 30.0 - 36.0 g/dL   RDW 13.7 11.5 - 15.5 %   Platelets 174 150 - 400 K/uL    ABGS No results for input(s): PHART, PO2ART, TCO2, HCO3 in the last 72 hours.  Invalid input(s): PCO2 CULTURES No results found for this or any previous visit (from the past 240 hour(s)). Studies/Results: Dg Chest 2 View  Result Date: 05/23/2017 CLINICAL DATA:  Chest pain EXAM: CHEST  2 VIEW COMPARISON:  04/21/2011 FINDINGS: The heart is moderately enlarged. Normal vascularity.  Bibasilar atelectasis. Low volumes. No pneumothorax. No pleural effusion. IMPRESSION: Cardiomegaly.  Bibasilar atelectasis. Electronically Signed   By: Marybelle Killings M.D.   On: 05/23/2017 12:19    Medications: I have reviewed the patient's current medications.  Assesment:  Active Problems:   Chest pain   Atrial fibrillation (HCC)   Essential hypertension   Hypokalemia   Hyperglycemia    Plan:  Medications reviewed Continue telemetry Continue Betablocker Continue anticoagulation Continue regular treatment.    LOS: 0 days   Ethon Wymer 05/24/2017, 10:24 AM

## 2017-05-25 ENCOUNTER — Encounter (HOSPITAL_COMMUNITY): Payer: Self-pay | Admitting: Adult Health

## 2017-05-25 DIAGNOSIS — I38 Endocarditis, valve unspecified: Secondary | ICD-10-CM | POA: Diagnosis not present

## 2017-05-25 DIAGNOSIS — I10 Essential (primary) hypertension: Secondary | ICD-10-CM | POA: Diagnosis not present

## 2017-05-25 DIAGNOSIS — I4891 Unspecified atrial fibrillation: Secondary | ICD-10-CM | POA: Diagnosis not present

## 2017-05-25 DIAGNOSIS — R079 Chest pain, unspecified: Secondary | ICD-10-CM | POA: Diagnosis not present

## 2017-05-25 DIAGNOSIS — E876 Hypokalemia: Secondary | ICD-10-CM | POA: Diagnosis not present

## 2017-05-25 LAB — CBC
HEMATOCRIT: 33.1 % — AB (ref 36.0–46.0)
HEMOGLOBIN: 10.2 g/dL — AB (ref 12.0–15.0)
MCH: 27.3 pg (ref 26.0–34.0)
MCHC: 30.8 g/dL (ref 30.0–36.0)
MCV: 88.7 fL (ref 78.0–100.0)
PLATELETS: 192 10*3/uL (ref 150–400)
RBC: 3.73 MIL/uL — AB (ref 3.87–5.11)
RDW: 13.8 % (ref 11.5–15.5)
WBC: 2.9 10*3/uL — ABNORMAL LOW (ref 4.0–10.5)

## 2017-05-25 MED ORDER — METOPROLOL TARTRATE 25 MG PO TABS
25.0000 mg | ORAL_TABLET | Freq: Two times a day (BID) | ORAL | Status: DC
Start: 2017-05-25 — End: 2017-05-25

## 2017-05-25 MED ORDER — METOPROLOL TARTRATE 25 MG PO TABS: 25.0000 mg | ORAL_TABLET | Freq: Two times a day (BID) | ORAL | 5 refills | Status: DC

## 2017-05-25 MED ORDER — APIXABAN 5 MG PO TABS: 5.0000 mg | ORAL_TABLET | Freq: Two times a day (BID) | ORAL | 3 refills | Status: AC

## 2017-05-25 MED ORDER — METOPROLOL TARTRATE 25 MG PO TABS
12.5000 mg | ORAL_TABLET | Freq: Two times a day (BID) | ORAL | 5 refills | Status: DC
Start: 2017-05-25 — End: 2018-07-30

## 2017-05-25 MED ORDER — METOPROLOL TARTRATE 25 MG PO TABS
12.5000 mg | ORAL_TABLET | Freq: Two times a day (BID) | ORAL | 3 refills | Status: DC
Start: 2017-05-25 — End: 2017-05-25

## 2017-05-25 MED ORDER — NITROGLYCERIN 0.4 MG SL SUBL: 0.4000 mg | SUBLINGUAL_TABLET | SUBLINGUAL | 12 refills | Status: AC | PRN

## 2017-05-25 NOTE — Progress Notes (Signed)
Patient states understanding of discharge instructions.  

## 2017-05-25 NOTE — Progress Notes (Signed)
Subjective: Patient feels better today. Her chest pain uis resolving. She is getting her anticoagulation. Echo is normal.  Objective: Vital signs in last 24 hours: Temp:  [97.8 F (36.6 C)-98.8 F (37.1 C)] 98.5 F (36.9 C) (06/11 0525) Pulse Rate:  [69-99] 69 (06/11 0525) Resp:  [15-19] 19 (06/11 0525) BP: (116-125)/(68-79) 119/68 (06/11 0525) SpO2:  [99 %-100 %] 99 % (06/11 0525) Weight change:  Last BM Date: 05/22/17  Intake/Output from previous day: 06/10 0701 - 06/11 0700 In: 600 [P.O.:600] Out: 750 [Urine:750]  PHYSICAL EXAM General appearance: alert and no distress Resp: clear to auscultation bilaterally Cardio: regularly irregular rhythm GI: soft, non-tender; bowel sounds normal; no masses,  no organomegaly Extremities: extremities normal, atraumatic, no cyanosis or edema  Lab Results:  Results for orders placed or performed during the hospital encounter of 05/23/17 (from the past 48 hour(s))  Basic metabolic panel     Status: Abnormal   Collection Time: 05/23/17 11:43 AM  Result Value Ref Range   Sodium 136 135 - 145 mmol/L   Potassium 3.3 (L) 3.5 - 5.1 mmol/L   Chloride 99 (L) 101 - 111 mmol/L   CO2 27 22 - 32 mmol/L   Glucose, Bld 169 (H) 65 - 99 mg/dL   BUN 12 6 - 20 mg/dL   Creatinine, Ser 1.09 (H) 0.44 - 1.00 mg/dL   Calcium 9.1 8.9 - 10.3 mg/dL   GFR calc non Af Amer 48 (L) >60 mL/min   GFR calc Af Amer 55 (L) >60 mL/min    Comment: (NOTE) The eGFR has been calculated using the CKD EPI equation. This calculation has not been validated in all clinical situations. eGFR's persistently <60 mL/min signify possible Chronic Kidney Disease.    Anion gap 10 5 - 15  CBC     Status: Abnormal   Collection Time: 05/23/17 11:43 AM  Result Value Ref Range   WBC 6.2 4.0 - 10.5 K/uL   RBC 4.05 3.87 - 5.11 MIL/uL   Hemoglobin 11.2 (L) 12.0 - 15.0 g/dL   HCT 35.8 (L) 36.0 - 46.0 %   MCV 88.4 78.0 - 100.0 fL   MCH 27.7 26.0 - 34.0 pg   MCHC 31.3 30.0 - 36.0 g/dL    RDW 13.8 11.5 - 15.5 %   Platelets 189 150 - 400 K/uL  Troponin I     Status: None   Collection Time: 05/23/17 11:43 AM  Result Value Ref Range   Troponin I <0.03 <0.03 ng/mL  Magnesium     Status: None   Collection Time: 05/23/17 11:43 AM  Result Value Ref Range   Magnesium 1.8 1.7 - 2.4 mg/dL  Hemoglobin A1c     Status: None   Collection Time: 05/23/17 11:43 AM  Result Value Ref Range   Hgb A1c MFr Bld 5.6 4.8 - 5.6 %    Comment: (NOTE)         Pre-diabetes: 5.7 - 6.4         Diabetes: >6.4         Glycemic control for adults with diabetes: <7.0    Mean Plasma Glucose 114 mg/dL    Comment: (NOTE) Performed At: [facility] [place], [place] [id number] Lindon Romp MD PY:[id number]   TSH     Status: None   Collection Time: 05/23/17 11:43 AM  Result Value Ref Range   TSH 1.955 0.350 - 4.500 uIU/mL    Comment: Performed by a 3rd Generation assay with a  functional sensitivity of <=0.01 uIU/mL.  Troponin I     Status: None   Collection Time: 05/23/17  4:43 PM  Result Value Ref Range   Troponin I <0.03 <0.03 ng/mL  Troponin I     Status: None   Collection Time: 05/23/17 11:38 PM  Result Value Ref Range   Troponin I <0.03 <0.03 ng/mL  Basic metabolic panel     Status: Abnormal   Collection Time: 05/24/17  6:40 AM  Result Value Ref Range   Sodium 137 135 - 145 mmol/L   Potassium 4.1 3.5 - 5.1 mmol/L    Comment: DELTA CHECK NOTED   Chloride 100 (L) 101 - 111 mmol/L   CO2 28 22 - 32 mmol/L   Glucose, Bld 106 (H) 65 - 99 mg/dL   BUN 16 6 - 20 mg/dL   Creatinine, Ser 1.00 0.44 - 1.00 mg/dL   Calcium 8.8 (L) 8.9 - 10.3 mg/dL   GFR calc non Af Amer 53 (L) >60 mL/min   GFR calc Af Amer >60 >60 mL/min    Comment: (NOTE) The eGFR has been calculated using the CKD EPI equation. This calculation has not been validated in all clinical situations. eGFR's persistently <60 mL/min signify possible Chronic Kidney Disease.    Anion gap 9 5 - 15  Lipid  panel     Status: Abnormal   Collection Time: 05/24/17  6:40 AM  Result Value Ref Range   Cholesterol 130 0 - 200 mg/dL   Triglycerides 42 <150 mg/dL   HDL 40 (L) >40 mg/dL   Total CHOL/HDL Ratio 3.3 RATIO   VLDL 8 0 - 40 mg/dL   LDL Cholesterol 82 0 - 99 mg/dL    Comment:        Total Cholesterol/HDL:CHD Risk Coronary Heart Disease Risk Table                     Men   Women  1/2 Average Risk   3.4   3.3  Average Risk       5.0   4.4  2 X Average Risk   9.6   7.1  3 X Average Risk  23.4   11.0        Use the calculated Patient Ratio above and the CHD Risk Table to determine the patient's CHD Risk.        ATP III CLASSIFICATION (LDL):  <100     mg/dL   Optimal  100-129  mg/dL   Near or Above                    Optimal  130-159  mg/dL   Borderline  160-189  mg/dL   High  >190     mg/dL   Very High   Magnesium     Status: None   Collection Time: 05/24/17  6:40 AM  Result Value Ref Range   Magnesium 2.0 1.7 - 2.4 mg/dL  CBC     Status: Abnormal   Collection Time: 05/24/17  6:40 AM  Result Value Ref Range   WBC 3.6 (L) 4.0 - 10.5 K/uL   RBC 3.73 (L) 3.87 - 5.11 MIL/uL   Hemoglobin 10.3 (L) 12.0 - 15.0 g/dL   HCT 32.9 (L) 36.0 - 46.0 %   MCV 88.2 78.0 - 100.0 fL   MCH 27.6 26.0 - 34.0 pg   MCHC 31.3 30.0 - 36.0 g/dL   RDW 13.7 11.5 - 15.5 %   Platelets  174 150 - 400 K/uL  CBC     Status: Abnormal   Collection Time: 05/25/17  4:29 AM  Result Value Ref Range   WBC 2.9 (L) 4.0 - 10.5 K/uL   RBC 3.73 (L) 3.87 - 5.11 MIL/uL   Hemoglobin 10.2 (L) 12.0 - 15.0 g/dL   HCT 33.1 (L) 36.0 - 46.0 %   MCV 88.7 78.0 - 100.0 fL   MCH 27.3 26.0 - 34.0 pg   MCHC 30.8 30.0 - 36.0 g/dL   RDW 13.8 11.5 - 15.5 %   Platelets 192 150 - 400 K/uL    ABGS No results for input(s): PHART, PO2ART, TCO2, HCO3 in the last 72 hours.  Invalid input(s): PCO2 CULTURES No results found for this or any previous visit (from the past 240 hour(s)). Studies/Results: Dg Chest 2 View  Result Date:  05/23/2017 CLINICAL DATA:  Chest pain EXAM: CHEST  2 VIEW COMPARISON:  04/21/2011 FINDINGS: The heart is moderately enlarged. Normal vascularity. Bibasilar atelectasis. Low volumes. No pneumothorax. No pleural effusion. IMPRESSION: Cardiomegaly.  Bibasilar atelectasis. Electronically Signed   By: Marybelle Killings M.D.   On: 05/23/2017 12:19   US Venous Img Lower Bilateral  Result Date: 05/24/2017 CLINICAL DATA:  Two day history of chest pain EXAM: BILATERAL LOWER EXTREMITY VENOUS DUPLEX ULTRASOUND TECHNIQUE: Gray-scale sonography with graded compression, as well as color Doppler and duplex ultrasound were performed to evaluate the lower extremity deep venous systems from the level of the common femoral vein and including the common femoral, femoral, profunda femoral, popliteal and calf veins including the posterior tibial, peroneal and gastrocnemius veins when visible. The superficial great saphenous vein was also interrogated. Spectral Doppler was utilized to evaluate flow at rest and with distal augmentation maneuvers in the common femoral, femoral and popliteal veins. COMPARISON:  None. FINDINGS: RIGHT LOWER EXTREMITY Common Femoral Vein: No evidence of thrombus. Normal compressibility, respiratory phasicity and response to augmentation. Saphenofemoral Junction: No evidence of thrombus. Normal compressibility and flow on color Doppler imaging. Profunda Femoral Vein: No evidence of thrombus. Normal compressibility and flow on color Doppler imaging. Femoral Vein: No evidence of thrombus. Normal compressibility, respiratory phasicity and response to augmentation. Popliteal Vein: No evidence of thrombus. Normal compressibility, respiratory phasicity and response to augmentation. Calf Veins: No evidence of thrombus. Normal compressibility and flow on color Doppler imaging. Superficial Great Saphenous Vein: No evidence of thrombus. Normal compressibility and flow on color Doppler imaging. Venous Reflux:  None. Other  Findings:  None. LEFT LOWER EXTREMITY Common Femoral Vein: No evidence of thrombus. Normal compressibility, respiratory phasicity and response to augmentation. Saphenofemoral Junction: No evidence of thrombus. Normal compressibility and flow on color Doppler imaging. Profunda Femoral Vein: No evidence of thrombus. Normal compressibility and flow on color Doppler imaging. Femoral Vein: No evidence of thrombus. Normal compressibility, respiratory phasicity and response to augmentation. Popliteal Vein: No evidence of thrombus. Normal compressibility, respiratory phasicity and response to augmentation. Calf Veins: No evidence of thrombus. Normal compressibility and flow on color Doppler imaging. Superficial Great Saphenous Vein: No evidence of thrombus. Normal compressibility and flow on color Doppler imaging. Venous Reflux:  None. Other Findings:  None. IMPRESSION: No evidence of deep venous thrombosis in either lower extremity. Electronically Signed   By: Lowella Grip III M.D.   On: 05/24/2017 10:44    Medications: I have reviewed the patient's current medications.  Assesment:  Active Problems:   Chest pain   Atrial fibrillation (HCC)   Essential hypertension   Hypokalemia   Hyperglycemia  Plan:  Medications reviewed Continue telemetry Continue Betablocker Continue anticoagulation Cardiology consult    LOS: 0 days   Tammy Nelson 05/25/2017, 8:23 AM

## 2017-05-25 NOTE — Consult Note (Addendum)
Cardiology Consultation:   Patient ID: Tammy Nelson; [id number]; 1941-09-23   Admit date: 05/23/2017 Date of Consult: 05/25/2017  Primary Care Provider: Carylon PerchesFagan, Roy, MD Primary Cardiologist: Ellis ParentsNew Purvis Sheffield(Koneswaran) Primary Electrophysiologist:  NA   Patient Profile:   Tammy Nelson is a 76 y.o. female with a hx of hypertension, who is being seen today for the evaluation of chest pain and newly diagnosed atrial fib, at the request of Dr. Felecia ShellingFanta.   History of Present Illness:   Lutie E Ida RogueSanderson presented to the ER on 05/23/2017 With complaints of a 3 day history of substernal chest pain, there was radiation to her neck. The pain was described as constant and pressure-like, was associated shortness of breath. Upper body movement made the chest discomfort worse. EKG completed in the ER revealed atrial fibrillation with controlled rate of 80 bpm, with nonspecific T-wave abnormalities.  She had been visiting her daughter in [place] last week, when on Thursday of last week she had substernal chest discomfort radiating to her back and up the back of her neck lasting on and off all day mostly constant. She fell is if she had "a cold in her chest" and began to feel tired. She denied any rapid heart rhythm or associated coughing or shortness of breath. The following day she felt better and was able to drive home from [place]. She spent some time with her family that day and evening after arriving home. On Saturday morning she awoke with worsening chest pressure, feeling weak, and presented to the emergency room for further evaluation. She again denied any rapid heart rhythm, palpitations, dizziness, or worsening dyspnea.  On arrival to the emergency room patient's blood pressure was 132/75, heart rate 97, O2 sat 99%, she was afebrile. Pertinent labs revealed potassium of 3.3, chloride 99, creatinine 1.09. Glucose 169. Hemoglobin was 11.2, hematocrit 35.8. Troponin has been negative 3. Potassium is been  repleted, with a follow-up potassium of 4.1. Chest x-ray revealed cardiomegaly without evidence of CHF or pneumonia. There has not been follow-up EKGs since admission.Doppler studies were negative for DVT.  Echocardiogram was completed on 05/23/2017 revealing normal LV systolic function with EF of 50-55%, no regional wall motion abnormalities,Left atrium was moderately dilated. There was mild mitral valve regurg and mild to moderate tricuspid regurg. PA pressure 36 mmHg. The patient was placed on apixaban 5 mg twice a day, CHADS VASC Score of 3. Started on metoprolol 12.5 mg twice a day, and taken off of lisinopril HCTZ 20/25 mg which she was taking at home.   Past Medical History:  Diagnosis Date  . Hypertension     Past Surgical History:  Procedure Laterality Date  . TOTAL HIP ARTHROPLASTY Right      Inpatient Medications: Scheduled Meds: . apixaban  5 mg Oral BID  . metoprolol tartrate  12.5 mg Oral BID   Continuous Infusions:  PRN Meds: acetaminophen **OR** acetaminophen, nitroGLYCERIN, ondansetron **OR** ondansetron (ZOFRAN) IV  Allergies:   No Known Allergies  Social History:   Social History   Social History  . Marital status: Widowed    Spouse name: N/A  . Number of children: N/A  . Years of education: N/A   Occupational History  . Not on file.   Social History Main Topics  . Smoking status: Never Smoker  . Smokeless tobacco: Never Used  . Alcohol use No  . Drug use: No  . Sexual activity: Not on file   Other Topics Concern  . Not on file  Social History Narrative  . No narrative on file    Family History:   The patient's family history includes COPD in her mother; Cancer in her father.  ROS:  Please see the history of present illness.  ROS  All other ROS reviewed and negative.     Physical Exam/Data:   Vitals:   05/24/17 1002 05/24/17 1416 05/24/17 2335 05/25/17 0525  BP: 116/69 121/79 125/70 119/68  Pulse: 99 83  69  Resp:  16 15 19   Temp:   98.8 F (37.1 C) 97.8 F (36.6 C) 98.5 F (36.9 C)  TempSrc:  Oral Oral Oral  SpO2:  99% 100% 99%  Weight:      Height:        Intake/Output Summary (Last 24 hours) at 05/25/17 1159 Last data filed at 05/25/17 0900  Gross per 24 hour  Intake              600 ml  Output              750 ml  Net             -150 ml   Filed Weights   05/23/17 1141 05/23/17 1154 05/23/17 1420  Weight: 180 lb (81.6 kg) 180 lb (81.6 kg) 210 lb 12.8 oz (95.6 kg)   Body mass index is 31.59 kg/m.  General:  Well nourished, well developed, in no acute distress HEENT: normal Lymph: no adenopathy Neck: no JVD Endocrine:  No thryomegaly Vascular: No carotid bruits; FA pulses 2+ bilaterally without bruits  Cardiac:  normal S1, S2; IRRR; no murmur, 1/6 systolic murmur. Lungs:  clear to auscultation bilaterally, no wheezing, rhonchi or rales  Abd: soft, nontender, no hepatomegaly  Ext: no edema Musculoskeletal:  No deformities, BUE and BLE strength normal and equal Skin: warm and dry  Neuro:  CNs 2-12 intact, no focal abnormalities noted Psych:  Normal affect    EKG:  The EKG was personally reviewed and demonstrates atrial fibrillation, with nonspecific T-wave abnormalities.  Relevant CV Studies: Echo completed this admission see above.  Laboratory Data:  Chemistry  Recent Labs Lab 05/23/17 1143 05/24/17 0640  NA 136 137  K 3.3* 4.1  CL 99* 100*  CO2 27 28  GLUCOSE 169* 106*  BUN 12 16  CREATININE 1.09* 1.00  CALCIUM 9.1 8.8*  GFRNONAA 48* 53*  GFRAA 55* >60  ANIONGAP 10 9    No results for input(s): PROT, ALBUMIN, AST, ALT, ALKPHOS, BILITOT in the last 168 hours. Hematology  Recent Labs Lab 05/23/17 1143 05/24/17 0640 05/25/17 0429  WBC 6.2 3.6* 2.9*  RBC 4.05 3.73* 3.73*  HGB 11.2* 10.3* 10.2*  HCT 35.8* 32.9* 33.1*  MCV 88.4 88.2 88.7  MCH 27.7 27.6 27.3  MCHC 31.3 31.3 30.8  RDW 13.8 13.7 13.8  PLT 189 174 192   Cardiac Enzymes  Recent Labs Lab 05/23/17 1143  05/23/17 1643 05/23/17 2338  TROPONINI <0.03 <0.03 <0.03   No results for input(s): TROPIPOC in the last 168 hours.  BNPNo results for input(s): BNP, PROBNP in the last 168 hours.  DDimer No results for input(s): DDIMER in the last 168 hours.  Radiology/Studies:  Dg Chest 2 View  Result Date: 05/23/2017 CLINICAL DATA:  Chest pain EXAM: CHEST  2 VIEW COMPARISON:  04/21/2011 FINDINGS: The heart is moderately enlarged. Normal vascularity. Bibasilar atelectasis. Low volumes. No pneumothorax. No pleural effusion. IMPRESSION: Cardiomegaly.  Bibasilar atelectasis. Electronically Signed   By: Dahlia Client.D.  On: 05/23/2017 12:19   US Venous Img Lower Bilateral  Result Date: 05/24/2017 CLINICAL DATA:  Two day history of chest pain EXAM: BILATERAL LOWER EXTREMITY VENOUS DUPLEX ULTRASOUND TECHNIQUE: Gray-scale sonography with graded compression, as well as color Doppler and duplex ultrasound were performed to evaluate the lower extremity deep venous systems from the level of the common femoral vein and including the common femoral, femoral, profunda femoral, popliteal and calf veins including the posterior tibial, peroneal and gastrocnemius veins when visible. The superficial great saphenous vein was also interrogated. Spectral Doppler was utilized to evaluate flow at rest and with distal augmentation maneuvers in the common femoral, femoral and popliteal veins. COMPARISON:  None. FINDINGS: RIGHT LOWER EXTREMITY Common Femoral Vein: No evidence of thrombus. Normal compressibility, respiratory phasicity and response to augmentation. Saphenofemoral Junction: No evidence of thrombus. Normal compressibility and flow on color Doppler imaging. Profunda Femoral Vein: No evidence of thrombus. Normal compressibility and flow on color Doppler imaging. Femoral Vein: No evidence of thrombus. Normal compressibility, respiratory phasicity and response to augmentation. Popliteal Vein: No evidence of thrombus. Normal  compressibility, respiratory phasicity and response to augmentation. Calf Veins: No evidence of thrombus. Normal compressibility and flow on color Doppler imaging. Superficial Great Saphenous Vein: No evidence of thrombus. Normal compressibility and flow on color Doppler imaging. Venous Reflux:  None. Other Findings:  None. LEFT LOWER EXTREMITY Common Femoral Vein: No evidence of thrombus. Normal compressibility, respiratory phasicity and response to augmentation. Saphenofemoral Junction: No evidence of thrombus. Normal compressibility and flow on color Doppler imaging. Profunda Femoral Vein: No evidence of thrombus. Normal compressibility and flow on color Doppler imaging. Femoral Vein: No evidence of thrombus. Normal compressibility, respiratory phasicity and response to augmentation. Popliteal Vein: No evidence of thrombus. Normal compressibility, respiratory phasicity and response to augmentation. Calf Veins: No evidence of thrombus. Normal compressibility and flow on color Doppler imaging. Superficial Great Saphenous Vein: No evidence of thrombus. Normal compressibility and flow on color Doppler imaging. Venous Reflux:  None. Other Findings:  None. IMPRESSION: No evidence of deep venous thrombosis in either lower extremity. Electronically Signed   By: Bretta Bang III M.D.   On: 05/24/2017 10:44    Assessment and Plan:   1. Newly diagnosed atrial fibrillation: Uncertain duration, rate is controlled. Prior to admission she was not on any AV nodal blocking agents, heart rate was in the 80s initially. We'll repeat her EKG. Telemetry reveals atrial fibrillation heart rate in the 70s, on metoprolol. She is recently been started on ELIQUIS 5 mg twice a day due to  CHADs VASC of 3. She is currently asymptomatic. This noted that her hemoglobin has decreased since admission with no active bleeding.   Echocardiogram reveals normal LV systolic function, with mildly dilated left atrium. Can consider TEE/ DCCV  after being on DOAC for minimum of 2 weeks. We'll discuss this further with Dr. Purvis Sheffield.  2. Hypertension: Blood pressure is currently well-controlled on current regimen. She was taking off of HCTZ and lisinopril on admission, and is now only on metoprolol 12.5 mg twice a day.  3. Hypokalemia: This is been repleted, potassium now 4.1.  4.  Chest discomfort: Typical and atypical features, described as a fullness in her chest, constant throughout the day last Thursday resolving on its own with recurrence and increased intensity on day of admission. Some increase with moving torso last week. Slight discomfort when up walking in the room.  Cardiac enzymes are negative. We'll repeat EKG as one has not been completed since  admission. Can consider ischemic workup as outpatient. She is no longer symptomatic with chest discomfort. Cardiovascular risk factors include hypertension.    Signed,  Joni Reining DNP, ANP  05/25/2017 11:59 AM   The patient was seen and examined, and I agree with the history, physical exam, assessment and plan as documented above, with modifications as noted below. I have also personally reviewed all relevant documentation, old records, labs, and both radiographic and cardiovascular studies. I have also independently interpreted old and new ECG's.  76 year old woman without aforementioned presentation complaining of symptoms of chest pain and found to be in new onset atrial fibrillation.  Upon admission heart rate was in the 100-120 bpm range. Troponins were normal. Chest x-ray was unremarkable.  Echocardiogram reviewed above with normal left ventricular systolic function, LVEF 50-55%, moderate biatrial dilatation, with mild mitral and mild to moderate tricuspid regurgitation.  I personally reviewed the ECG performed on admission which demonstrated rapid atrial fibrillation, 117 bpm.  I personally reviewed the ECG performed today which demonstrated atrial fibrillation, 88  bpm.  She was started on metoprolol 12.5 mg twice daily and Eliquis 5 mg twice daily.  CHADSVASC score 4 (age, HTN, gender) thus anticoagulation is indicated to reduce thromboembolic risk.  She is currently asymptomatic but heart rates have still gotten up to 110-115 bpm while at rest. I will increase metoprolol to 25 mg twice daily.  If she became symptomatic again with optimal heart rate control, direct current cardioversion (without TEE) could be considered after completion of 3 weeks of anticoagulation.  She plans to spend the summer and [place]. She will need to find a cardiologist there.  Prentice Docker, MD, [facility]  05/25/2017 3:40 PM

## 2017-05-29 NOTE — Discharge Summary (Signed)
Physician Discharge Summary  Patient ID: Tammy Nelson MRN: [id number] DOB/AGE: [birth year] 78 y.o. Primary Care Physician:Fagan, Channing Muttersoy, MD Admit date: 05/23/2017 Discharge date: 05/25/2017   Discharge Diagnoses:    Active Problems:   Chest pain   Atrial fibrillation (HCC)   Essential hypertension   Hypokalemia   Hyperglycemia   Allergies as of 05/25/2017   No Known Allergies     Medication List    STOP taking these medications   cephALEXin 500 MG capsule Commonly known as:  KEFLEX   lisinopril-hydrochlorothiazide 20-25 MG tablet Commonly known as:  PRINZIDE,ZESTORETIC     TAKE these medications   apixaban 5 MG Tabs tablet Commonly known as:  ELIQUIS Take 1 tablet (5 mg total) by mouth 2 (two) times daily.   LORazepam 0.5 MG tablet Commonly known as:  ATIVAN Take 0.5 mg by mouth 2 (two) times daily.   metoprolol tartrate 25 MG tablet Commonly known as:  LOPRESSOR Take 0.5 tablets (12.5 mg total) by mouth 2 (two) times daily.   nitroGLYCERIN 0.4 MG SL tablet Commonly known as:  NITROSTAT Place 1 tablet (0.4 mg total) under the tongue every 5 (five) minutes as needed for chest pain.       Discharged Condition: improved    Consults: cardiology  Significant Diagnostic Studies: Dg Chest 2 View  Result Date: 05/23/2017 CLINICAL DATA:  Chest pain EXAM: CHEST  2 VIEW COMPARISON:  04/21/2011 FINDINGS: The heart is moderately enlarged. Normal vascularity. Bibasilar atelectasis. Low volumes. No pneumothorax. No pleural effusion. IMPRESSION: Cardiomegaly.  Bibasilar atelectasis. Electronically Signed   By: Jolaine ClickArthur  Hoss M.D.   On: 05/23/2017 12:19   [place] Venous Img Lower Bilateral  Result Date: 05/24/2017 CLINICAL DATA:  Two day history of chest pain EXAM: BILATERAL LOWER EXTREMITY VENOUS DUPLEX ULTRASOUND TECHNIQUE: Gray-scale sonography with graded compression, as well as color Doppler and duplex ultrasound were performed to evaluate the lower extremity deep venous  systems from the level of the common femoral vein and including the common femoral, femoral, profunda femoral, popliteal and calf veins including the posterior tibial, peroneal and gastrocnemius veins when visible. The superficial great saphenous vein was also interrogated. Spectral Doppler was utilized to evaluate flow at rest and with distal augmentation maneuvers in the common femoral, femoral and popliteal veins. COMPARISON:  None. FINDINGS: RIGHT LOWER EXTREMITY Common Femoral Vein: No evidence of thrombus. Normal compressibility, respiratory phasicity and response to augmentation. Saphenofemoral Junction: No evidence of thrombus. Normal compressibility and flow on color Doppler imaging. Profunda Femoral Vein: No evidence of thrombus. Normal compressibility and flow on color Doppler imaging. Femoral Vein: No evidence of thrombus. Normal compressibility, respiratory phasicity and response to augmentation. Popliteal Vein: No evidence of thrombus. Normal compressibility, respiratory phasicity and response to augmentation. Calf Veins: No evidence of thrombus. Normal compressibility and flow on color Doppler imaging. Superficial Great Saphenous Vein: No evidence of thrombus. Normal compressibility and flow on color Doppler imaging. Venous Reflux:  None. Other Findings:  None. LEFT LOWER EXTREMITY Common Femoral Vein: No evidence of thrombus. Normal compressibility, respiratory phasicity and response to augmentation. Saphenofemoral Junction: No evidence of thrombus. Normal compressibility and flow on color Doppler imaging. Profunda Femoral Vein: No evidence of thrombus. Normal compressibility and flow on color Doppler imaging. Femoral Vein: No evidence of thrombus. Normal compressibility, respiratory phasicity and response to augmentation. Popliteal Vein: No evidence of thrombus. Normal compressibility, respiratory phasicity and response to augmentation. Calf Veins: No evidence of thrombus. Normal compressibility and  flow on color Doppler imaging. Superficial  Great Saphenous Vein: No evidence of thrombus. Normal compressibility and flow on color Doppler imaging. Venous Reflux:  None. Other Findings:  None. IMPRESSION: No evidence of deep venous thrombosis in either lower extremity. Electronically Signed   By: Bretta Bang III M.D.   On: 05/24/2017 10:44    Lab Results: Basic Metabolic Panel: No results for input(s): NA, K, CL, CO2, GLUCOSE, BUN, CREATININE, CALCIUM, MG, PHOS in the last 72 hours. Liver Function Tests: No results for input(s): AST, ALT, ALKPHOS, BILITOT, PROT, ALBUMIN in the last 72 hours.   CBC: No results for input(s): WBC, NEUTROABS, HGB, HCT, MCV, PLT in the last 72 hours.  No results found for this or any previous visit (from the past 240 hour(s)).   Hospital Course:  This is a 76 years old female with history of multiple medical illnesses was admitted due to new onset atrial fibrillation. She was started on Eliquis 5 mg po BID. Her rate was controlled. She was evaluated and echo was done. Patient was discharged in stable condition.   Discharge Exam: Blood pressure 125/74, pulse 97, temperature 98 F (36.7 C), resp. rate 18, height 5' 8.5" (1.74 m), weight 95.6 kg (210 lb 12.8 oz), SpO2 98 %.     Disposition:  Home.    Follow-up Information    Avon Gully, MD Follow up in 2 week(s).   Specialty:  Internal Medicine Contact information: [address] [place] [address] [phone]           Signed: Avon Gully   05/29/2017, 8:17 AM

## 2017-07-17 DIAGNOSIS — I5030 Unspecified diastolic (congestive) heart failure: Secondary | ICD-10-CM | POA: Diagnosis not present

## 2017-07-17 DIAGNOSIS — R0600 Dyspnea, unspecified: Secondary | ICD-10-CM | POA: Diagnosis not present

## 2017-07-17 DIAGNOSIS — R740 Nonspecific elevation of levels of transaminase and lactic acid dehydrogenase [LDH]: Secondary | ICD-10-CM | POA: Diagnosis not present

## 2017-07-17 DIAGNOSIS — R0789 Other chest pain: Secondary | ICD-10-CM | POA: Diagnosis not present

## 2017-07-17 DIAGNOSIS — I4891 Unspecified atrial fibrillation: Secondary | ICD-10-CM | POA: Diagnosis not present

## 2017-07-17 DIAGNOSIS — I110 Hypertensive heart disease with heart failure: Secondary | ICD-10-CM | POA: Diagnosis not present

## 2017-07-17 DIAGNOSIS — Z79899 Other long term (current) drug therapy: Secondary | ICD-10-CM | POA: Diagnosis not present

## 2017-07-17 DIAGNOSIS — R2243 Localized swelling, mass and lump, lower limb, bilateral: Secondary | ICD-10-CM | POA: Diagnosis not present

## 2017-07-17 DIAGNOSIS — R0609 Other forms of dyspnea: Secondary | ICD-10-CM | POA: Diagnosis not present

## 2017-07-17 DIAGNOSIS — R0602 Shortness of breath: Secondary | ICD-10-CM | POA: Diagnosis not present

## 2017-07-17 DIAGNOSIS — L509 Urticaria, unspecified: Secondary | ICD-10-CM | POA: Diagnosis not present

## 2017-07-17 DIAGNOSIS — D509 Iron deficiency anemia, unspecified: Secondary | ICD-10-CM | POA: Diagnosis not present

## 2017-07-17 DIAGNOSIS — R600 Localized edema: Secondary | ICD-10-CM | POA: Diagnosis not present

## 2017-07-17 DIAGNOSIS — I482 Chronic atrial fibrillation: Secondary | ICD-10-CM | POA: Diagnosis not present

## 2017-07-17 DIAGNOSIS — M7989 Other specified soft tissue disorders: Secondary | ICD-10-CM | POA: Diagnosis not present

## 2017-07-18 DIAGNOSIS — R2243 Localized swelling, mass and lump, lower limb, bilateral: Secondary | ICD-10-CM | POA: Diagnosis not present

## 2017-07-18 DIAGNOSIS — R079 Chest pain, unspecified: Secondary | ICD-10-CM | POA: Diagnosis not present

## 2017-07-18 DIAGNOSIS — R0609 Other forms of dyspnea: Secondary | ICD-10-CM | POA: Diagnosis not present

## 2017-07-18 DIAGNOSIS — I482 Chronic atrial fibrillation: Secondary | ICD-10-CM | POA: Diagnosis not present

## 2017-08-07 DIAGNOSIS — I10 Essential (primary) hypertension: Secondary | ICD-10-CM | POA: Diagnosis not present

## 2017-08-07 DIAGNOSIS — R600 Localized edema: Secondary | ICD-10-CM | POA: Diagnosis not present

## 2017-08-07 DIAGNOSIS — I481 Persistent atrial fibrillation: Secondary | ICD-10-CM | POA: Diagnosis not present

## 2017-08-07 DIAGNOSIS — I4891 Unspecified atrial fibrillation: Secondary | ICD-10-CM | POA: Diagnosis not present

## 2017-08-07 DIAGNOSIS — D649 Anemia, unspecified: Secondary | ICD-10-CM | POA: Diagnosis not present

## 2017-08-07 DIAGNOSIS — I5032 Chronic diastolic (congestive) heart failure: Secondary | ICD-10-CM | POA: Diagnosis not present

## 2017-08-07 DIAGNOSIS — Z7901 Long term (current) use of anticoagulants: Secondary | ICD-10-CM | POA: Diagnosis not present

## 2017-09-01 DIAGNOSIS — I10 Essential (primary) hypertension: Secondary | ICD-10-CM | POA: Diagnosis not present

## 2017-09-01 DIAGNOSIS — I482 Chronic atrial fibrillation: Secondary | ICD-10-CM | POA: Diagnosis not present

## 2017-11-18 DIAGNOSIS — Z96641 Presence of right artificial hip joint: Secondary | ICD-10-CM | POA: Diagnosis not present

## 2017-11-18 DIAGNOSIS — M545 Low back pain: Secondary | ICD-10-CM | POA: Diagnosis not present

## 2017-11-18 DIAGNOSIS — I4891 Unspecified atrial fibrillation: Secondary | ICD-10-CM | POA: Diagnosis not present

## 2017-11-18 DIAGNOSIS — M25551 Pain in right hip: Secondary | ICD-10-CM | POA: Diagnosis not present

## 2017-11-18 DIAGNOSIS — Z7901 Long term (current) use of anticoagulants: Secondary | ICD-10-CM | POA: Diagnosis not present

## 2017-11-18 DIAGNOSIS — I10 Essential (primary) hypertension: Secondary | ICD-10-CM | POA: Diagnosis not present

## 2017-11-18 DIAGNOSIS — M48061 Spinal stenosis, lumbar region without neurogenic claudication: Secondary | ICD-10-CM | POA: Diagnosis not present

## 2017-11-18 DIAGNOSIS — N3000 Acute cystitis without hematuria: Secondary | ICD-10-CM | POA: Diagnosis not present

## 2017-11-18 DIAGNOSIS — Z79899 Other long term (current) drug therapy: Secondary | ICD-10-CM | POA: Diagnosis not present

## 2017-11-18 DIAGNOSIS — M47816 Spondylosis without myelopathy or radiculopathy, lumbar region: Secondary | ICD-10-CM | POA: Diagnosis not present

## 2017-12-10 DIAGNOSIS — I10 Essential (primary) hypertension: Secondary | ICD-10-CM | POA: Diagnosis not present

## 2017-12-10 DIAGNOSIS — I482 Chronic atrial fibrillation: Secondary | ICD-10-CM | POA: Diagnosis not present

## 2018-03-11 DIAGNOSIS — I482 Chronic atrial fibrillation: Secondary | ICD-10-CM | POA: Diagnosis not present

## 2018-03-11 DIAGNOSIS — I10 Essential (primary) hypertension: Secondary | ICD-10-CM | POA: Diagnosis not present

## 2018-06-16 DIAGNOSIS — Z79899 Other long term (current) drug therapy: Secondary | ICD-10-CM | POA: Diagnosis not present

## 2018-06-16 DIAGNOSIS — I10 Essential (primary) hypertension: Secondary | ICD-10-CM | POA: Diagnosis not present

## 2018-06-16 DIAGNOSIS — Z0000 Encounter for general adult medical examination without abnormal findings: Secondary | ICD-10-CM | POA: Diagnosis not present

## 2018-06-16 DIAGNOSIS — Z1389 Encounter for screening for other disorder: Secondary | ICD-10-CM | POA: Diagnosis not present

## 2018-06-16 DIAGNOSIS — I482 Chronic atrial fibrillation: Secondary | ICD-10-CM | POA: Diagnosis not present

## 2018-07-22 DIAGNOSIS — I10 Essential (primary) hypertension: Secondary | ICD-10-CM | POA: Diagnosis not present

## 2018-07-22 DIAGNOSIS — I5041 Acute combined systolic (congestive) and diastolic (congestive) heart failure: Secondary | ICD-10-CM | POA: Diagnosis not present

## 2018-07-22 DIAGNOSIS — I482 Chronic atrial fibrillation: Secondary | ICD-10-CM | POA: Diagnosis not present

## 2018-07-30 ENCOUNTER — Ambulatory Visit (INDEPENDENT_AMBULATORY_CARE_PROVIDER_SITE_OTHER): Payer: Medicare Other | Admitting: Cardiology

## 2018-07-30 ENCOUNTER — Encounter: Payer: Self-pay | Admitting: Cardiology

## 2018-07-30 VITALS — BP 114/64 | HR 57 | Ht 68.0 in | Wt 175.0 lb

## 2018-07-30 DIAGNOSIS — I5041 Acute combined systolic (congestive) and diastolic (congestive) heart failure: Secondary | ICD-10-CM | POA: Diagnosis not present

## 2018-07-30 DIAGNOSIS — I4891 Unspecified atrial fibrillation: Secondary | ICD-10-CM

## 2018-07-30 DIAGNOSIS — I482 Chronic atrial fibrillation: Secondary | ICD-10-CM | POA: Diagnosis not present

## 2018-07-30 DIAGNOSIS — I10 Essential (primary) hypertension: Secondary | ICD-10-CM

## 2018-07-30 DIAGNOSIS — I5032 Chronic diastolic (congestive) heart failure: Secondary | ICD-10-CM

## 2018-07-30 NOTE — Patient Instructions (Signed)
Medication Instructions:  STOP ASPIRIN 81   Labwork: I WILL REQUEST LABS FROM PCP  Testing/Procedures: NONE  Follow-Up: Your physician wants you to follow-up in: 6 MONTHS.  You will receive a reminder letter in the mail two months in advance. If you don't receive a letter, please call our office to schedule the follow-up appointment.   Any Other Special Instructions Will Be Listed Below (If Applicable).     If you need a refill on your cardiac medications before your next appointment, please call your pharmacy.

## 2018-07-30 NOTE — Progress Notes (Signed)
Clinical Summary [name] is a 77 y.o.female last seen by Dr Purvis SheffieldKoneswaran in 2018. This is is our first visit together. [place], has a cardiolgist down there with records on Care Everywhere  1. Afib - new diagnosis during 05/2017 admission, seen by Dr Purvis SheffieldKoneswaran during consultation - CHADS2Vasc score of 3, was started on eliquis. Has been on lopressor for rate control  - seen by cardiology in [place] for follow up 07/2017 - no recent palpitations. Compliant with eliquis.    2. HTN - compliant with meds - home numbers around 110s/60s   3. Chronic diastolic HF - from notes her LE edema is thought to be due to both diastolic HF and venous insufficiency.  - compliant with diuretic.   Past Medical History:  Diagnosis Date  . Hypertension      No Known Allergies   Current Outpatient Medications  Medication Sig Dispense Refill  . apixaban (ELIQUIS) 5 MG TABS tablet Take 1 tablet (5 mg total) by mouth 2 (two) times daily. 60 tablet 3  . LORazepam (ATIVAN) 0.5 MG tablet Take 0.5 mg by mouth 2 (two) times daily.    . metoprolol tartrate (LOPRESSOR) 25 MG tablet Take 0.5 tablets (12.5 mg total) by mouth 2 (two) times daily. 60 tablet 5  . nitroGLYCERIN (NITROSTAT) 0.4 MG SL tablet Place 1 tablet (0.4 mg total) under the tongue every 5 (five) minutes as needed for chest pain. 30 tablet 12   No current facility-administered medications for this visit.      Past Surgical History:  Procedure Laterality Date  . TOTAL HIP ARTHROPLASTY Right      No Known Allergies    Family History  Problem Relation Age of Onset  . COPD Mother   . Cancer Father      Social History [name] reports that she has never smoked. She has never used smokeless tobacco. [name] reports that she does not drink alcohol.   Review of Systems CONSTITUTIONAL: No weight loss, fever, chills, weakness or fatigue.  HEENT: Eyes: No visual loss, blurred vision,  double vision or yellow sclerae.No hearing loss, sneezing, congestion, runny nose or sore throat.  SKIN: No rash or itching.  CARDIOVASCULAR: per hpi RESPIRATORY: No shortness of breath, cough or sputum.  GASTROINTESTINAL: No anorexia, nausea, vomiting or diarrhea. No abdominal pain or blood.  GENITOURINARY: No burning on urination, no polyuria NEUROLOGICAL: No headache, dizziness, syncope, paralysis, ataxia, numbness or tingling in the extremities. No change in bowel or bladder control.  MUSCULOSKELETAL: No muscle, back pain, joint pain or stiffness.  LYMPHATICS: No enlarged nodes. No history of splenectomy.  PSYCHIATRIC: No history of depression or anxiety.  ENDOCRINOLOGIC: No reports of sweating, cold or heat intolerance. No polyuria or polydipsia.  Marland Kitchen.   Physical Examination Vitals:   07/30/18 1056 07/30/18 1102  BP: 108/60 114/64  Pulse: (!) 57   SpO2: 98%    Vitals:   07/30/18 1056  Weight: 175 lb (79.4 kg)  Height: 5\' 8"  (1.727 m)    Gen: resting comfortably, no acute distress HEENT: no scleral icterus, pupils equal round and reactive, no palptable cervical adenopathy,  CV Resp: Clear to auscultation bilaterally GI: abdomen is soft, non-tender, non-distended, normal bowel sounds, no hepatosplenomegaly MSK: extremities are warm, no edema.  Skin: warm, no rash Neuro:  no focal deficits Psych: appropriate affect   Diagnostic Studies 05/2017 echo Study Conclusions  - Left ventricle: The cavity size was normal. Systolic  function was   normal. The estimated ejection fraction was in the range of 50%   to 55%. Wall motion was normal; there were no regional wall   motion abnormalities. Left ventricular diastolic function   parameters were normal. - Mitral valve: There was mild regurgitation. - Left atrium: The atrium was moderately dilated. - Right atrium: The atrium was moderately dilated. - Atrial septum: No defect or patent foramen ovale was identified. - Tricuspid  valve: There was mild-moderate regurgitation. - Pulmonary arteries: PA peak pressure: 36 mm Hg (S).   07/2017 Nuclear stress [facility] MPI Stress test results on August 4,2018:   Baseline ECG: Atrial flutter or atrial fibrillation.  Stress ECG impression: Normal ECG.  No significant perfusion abnormalities.  Gated SPECT / PET: The calculated left ventricular ejection fraction at  stress is 61%.  Stress test conclusions: Negative stress test. Low risk of inducible  myocardial ischemia.  Baseline ECG: Normal sinus rhythm.   Assessment and Plan    1. Afib - doing well, no symptoms. EKG today shows rate controlled afib - continue current meds. Stop ASA 81mg  daily  2. Chronic diastolic HF - no symptoms, she is euvolemic - continue current meds  3. HTN - at goal, continue current meds   Follow Dr Purvis SheffieldKoneswaran in 6 months, he had seen as original consultation in hospital    Antoine PocheJonathan F. Abeer Iversen, M.D

## 2018-08-09 DIAGNOSIS — M71021 Abscess of bursa, right elbow: Secondary | ICD-10-CM | POA: Diagnosis not present

## 2018-08-12 IMAGING — US US EXTREM LOW VENOUS BILAT
1 series · 13 of 24 positions shown · non-contrast
Comparison: None.

CLINICAL DATA: Two day history of chest pain

EXAM:
BILATERAL LOWER EXTREMITY VENOUS DUPLEX ULTRASOUND
TECHNIQUE: Gray-scale sonography with graded compression, as well as color
Doppler and duplex ultrasound were performed to evaluate the lower
extremity deep venous systems from the level of the common femoral
vein and including the common femoral, femoral, profunda femoral,
popliteal and calf veins including the posterior tibial, peroneal
and gastrocnemius veins when visible. The superficial great
saphenous vein was also interrogated. Spectral Doppler was utilized
to evaluate flow at rest and with distal augmentation maneuvers in
the common femoral, femoral and popliteal veins.

[Series 1: us extrem low venous bilat · 0.08mm/px · 13 of 67 slices shown]
[im 1/67]
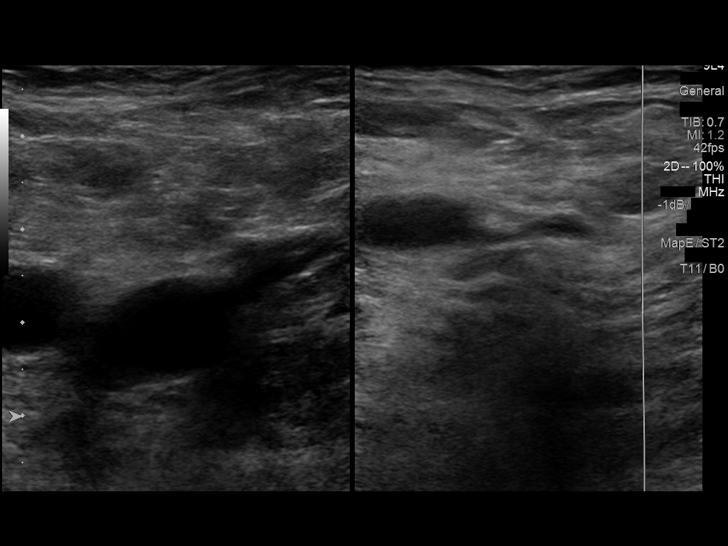
[im 6/67]
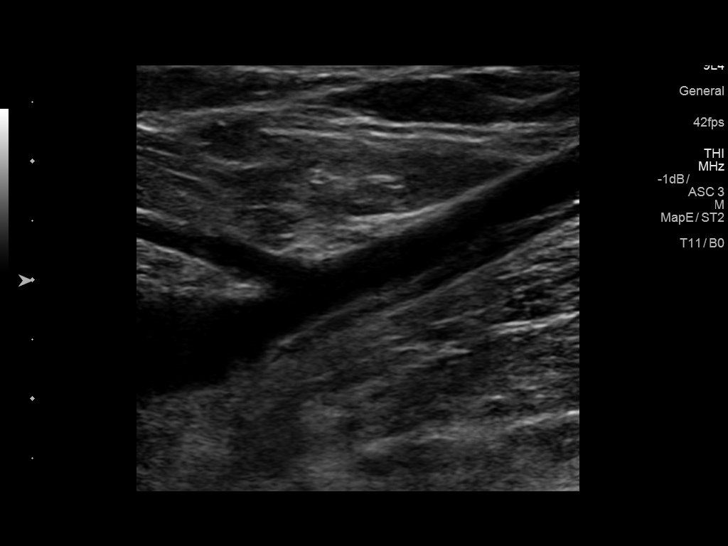
[im 12/67]
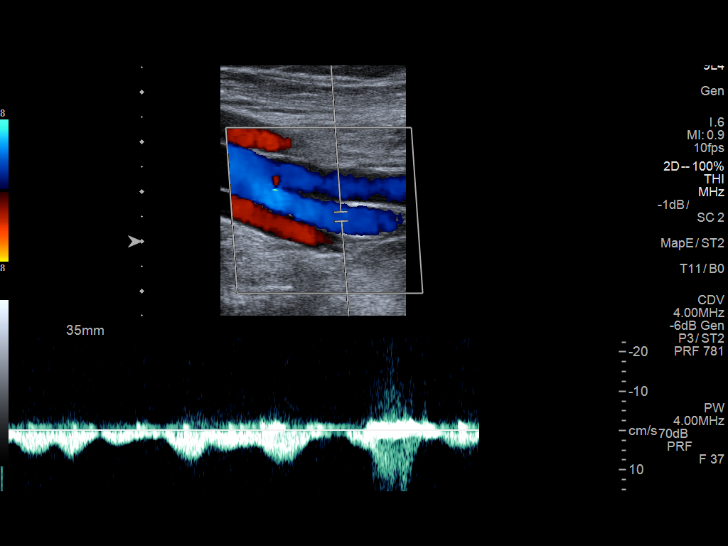
[im 18/67]
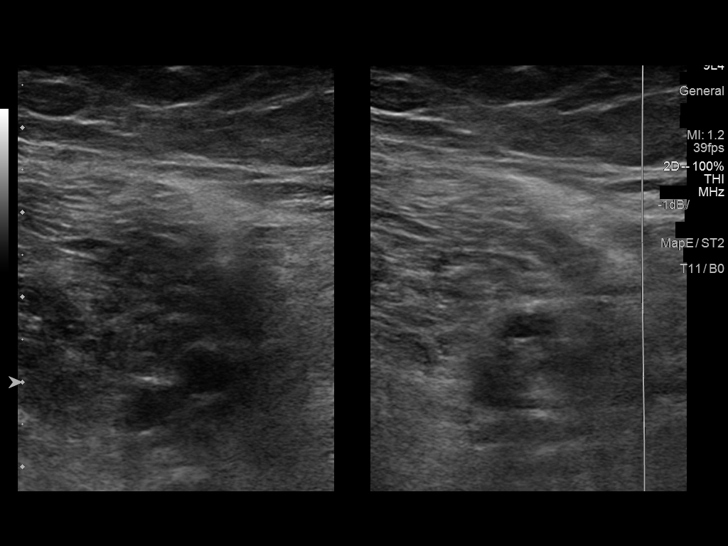
[im 23/67]
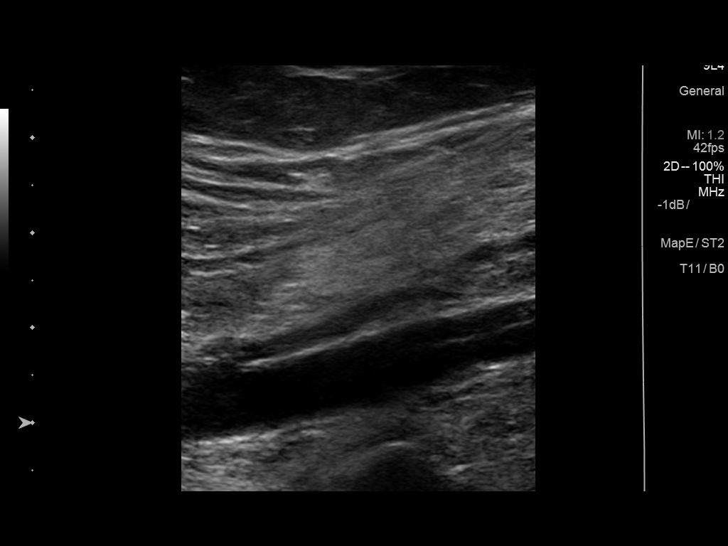
[im 29/67]
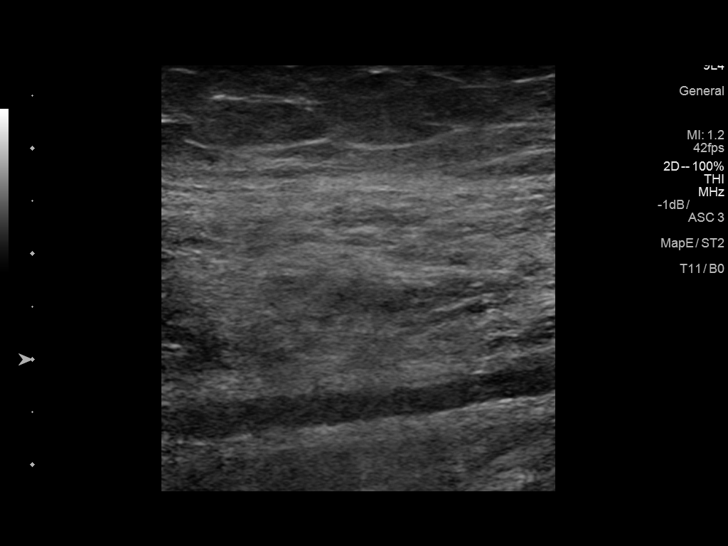
[im 35/67]
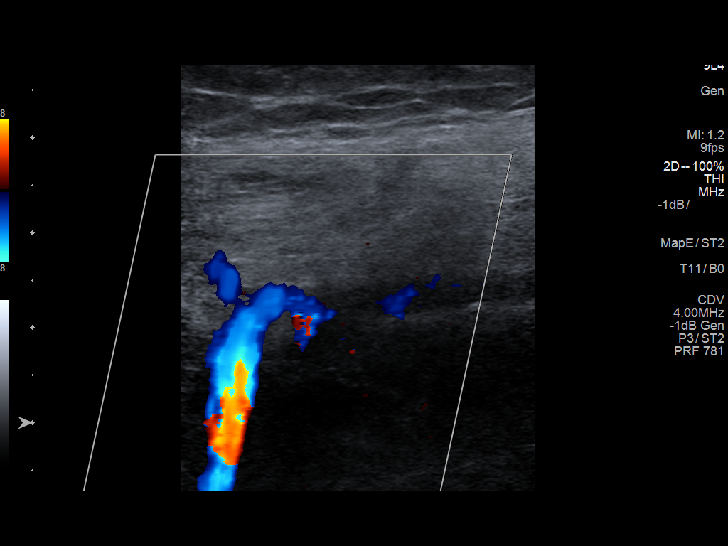
[im 38/67]
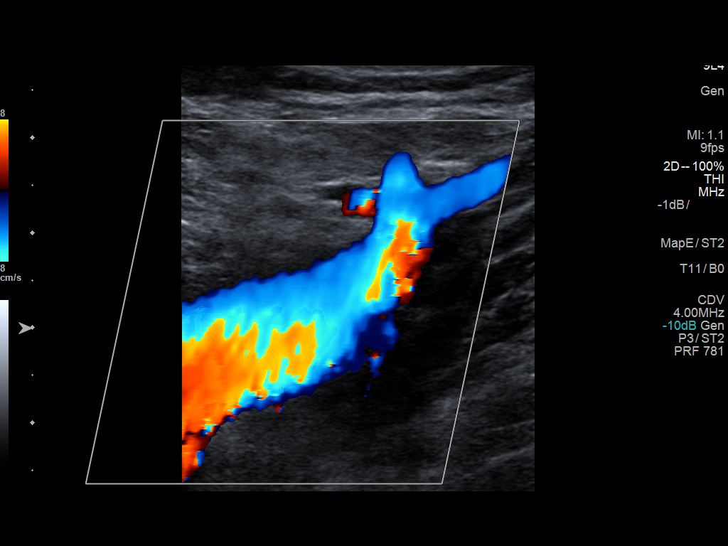
[im 44/67]
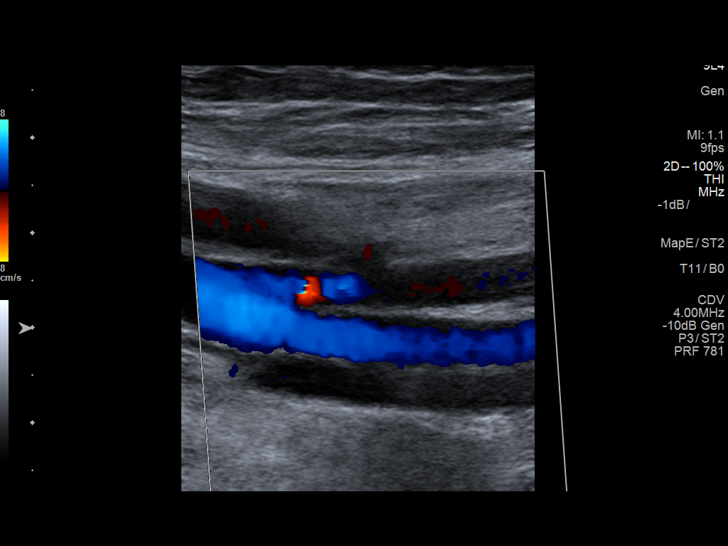
[im 49/67]
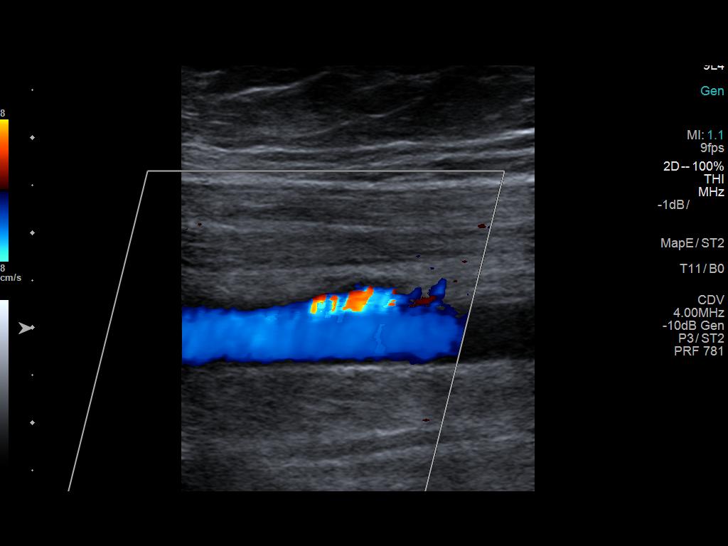
[im 55/67]
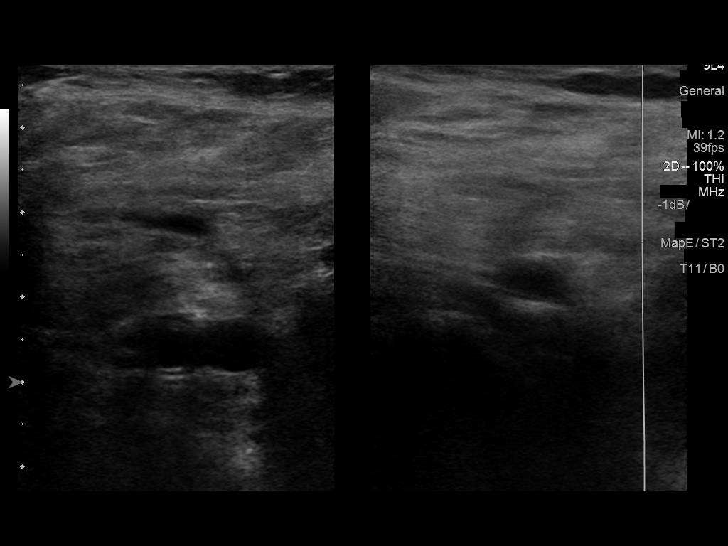
[im 61/67]
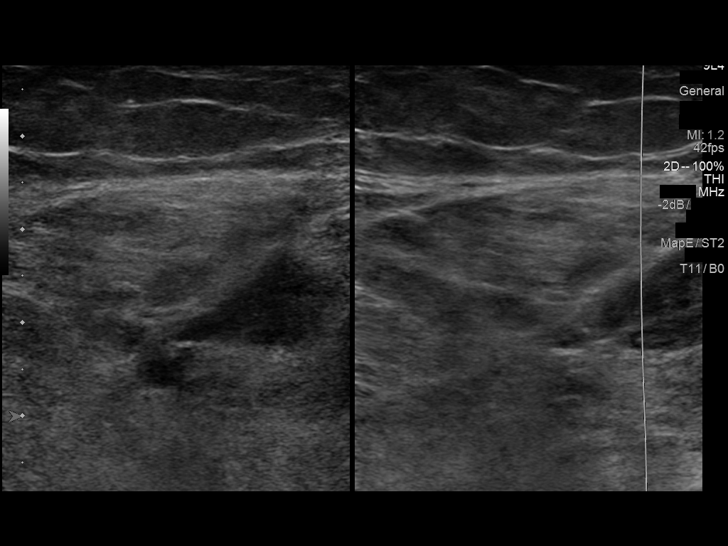
[im 67/67]
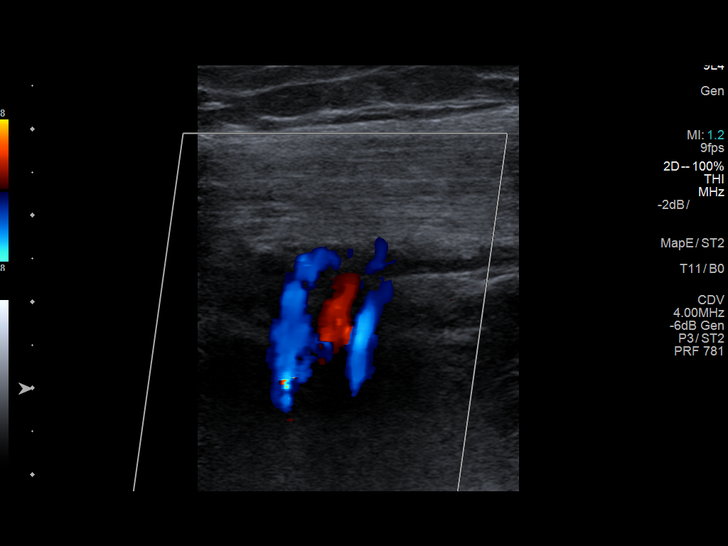

[13 of 24 positions shown; findings below may reference images not displayed]

FINDINGS: RIGHT LOWER EXTREMITY

Common Femoral Vein: No evidence of thrombus. Normal
compressibility, respiratory phasicity and response to augmentation.

Saphenofemoral Junction: No evidence of thrombus. Normal
compressibility and flow on color Doppler imaging.

Profunda Femoral Vein: No evidence of thrombus. Normal
compressibility and flow on color Doppler imaging.

Femoral Vein: No evidence of thrombus. Normal compressibility,
respiratory phasicity and response to augmentation.

Popliteal Vein: No evidence of thrombus. Normal compressibility,
respiratory phasicity and response to augmentation.

Calf Veins: No evidence of thrombus. Normal compressibility and flow
on color Doppler imaging.

Superficial Great Saphenous Vein: No evidence of thrombus. Normal
compressibility and flow on color Doppler imaging.

Venous Reflux:  None.

Other Findings:  None.

LEFT LOWER EXTREMITY

Common Femoral Vein: No evidence of thrombus. Normal
compressibility, respiratory phasicity and response to augmentation.

Saphenofemoral Junction: No evidence of thrombus. Normal
compressibility and flow on color Doppler imaging.

Profunda Femoral Vein: No evidence of thrombus. Normal
compressibility and flow on color Doppler imaging.

Femoral Vein: No evidence of thrombus. Normal compressibility,
respiratory phasicity and response to augmentation.

Popliteal Vein: No evidence of thrombus. Normal compressibility,
respiratory phasicity and response to augmentation.

Calf Veins: No evidence of thrombus. Normal compressibility and flow
on color Doppler imaging.

Superficial Great Saphenous Vein: No evidence of thrombus. Normal
compressibility and flow on color Doppler imaging.

Venous Reflux:  None.

Other Findings:  None.
IMPRESSION: No evidence of deep venous thrombosis in either lower extremity.

## 2018-11-02 DIAGNOSIS — I4819 Other persistent atrial fibrillation: Secondary | ICD-10-CM | POA: Diagnosis not present

## 2018-11-02 DIAGNOSIS — I10 Essential (primary) hypertension: Secondary | ICD-10-CM | POA: Diagnosis not present

## 2018-11-02 DIAGNOSIS — E782 Mixed hyperlipidemia: Secondary | ICD-10-CM | POA: Diagnosis not present

## 2018-12-09 ENCOUNTER — Telehealth: Payer: Self-pay | Admitting: Cardiology

## 2018-12-09 NOTE — Telephone Encounter (Signed)
[place] called [place]

## 2019-01-28 DIAGNOSIS — M25532 Pain in left wrist: Secondary | ICD-10-CM | POA: Diagnosis not present

## 2019-01-28 DIAGNOSIS — Z79899 Other long term (current) drug therapy: Secondary | ICD-10-CM | POA: Diagnosis not present

## 2019-01-28 DIAGNOSIS — R9431 Abnormal electrocardiogram [ECG] [EKG]: Secondary | ICD-10-CM | POA: Diagnosis not present

## 2019-01-28 DIAGNOSIS — M19032 Primary osteoarthritis, left wrist: Secondary | ICD-10-CM | POA: Diagnosis not present

## 2019-01-28 DIAGNOSIS — Z7901 Long term (current) use of anticoagulants: Secondary | ICD-10-CM | POA: Diagnosis not present

## 2019-01-28 DIAGNOSIS — M1990 Unspecified osteoarthritis, unspecified site: Secondary | ICD-10-CM | POA: Diagnosis not present

## 2019-01-28 DIAGNOSIS — I4891 Unspecified atrial fibrillation: Secondary | ICD-10-CM | POA: Diagnosis not present

## 2019-01-28 DIAGNOSIS — R079 Chest pain, unspecified: Secondary | ICD-10-CM | POA: Diagnosis not present

## 2019-01-28 DIAGNOSIS — D696 Thrombocytopenia, unspecified: Secondary | ICD-10-CM | POA: Diagnosis not present

## 2019-01-28 DIAGNOSIS — I10 Essential (primary) hypertension: Secondary | ICD-10-CM | POA: Diagnosis not present

## 2019-01-28 DIAGNOSIS — S6992XA Unspecified injury of left wrist, hand and finger(s), initial encounter: Secondary | ICD-10-CM | POA: Diagnosis not present

## 2019-01-28 DIAGNOSIS — M79642 Pain in left hand: Secondary | ICD-10-CM | POA: Diagnosis not present

## 2019-01-29 DIAGNOSIS — S6992XA Unspecified injury of left wrist, hand and finger(s), initial encounter: Secondary | ICD-10-CM | POA: Diagnosis not present

## 2019-01-29 DIAGNOSIS — M25532 Pain in left wrist: Secondary | ICD-10-CM | POA: Diagnosis not present

## 2019-01-29 DIAGNOSIS — M79642 Pain in left hand: Secondary | ICD-10-CM | POA: Diagnosis not present

## 2019-01-29 DIAGNOSIS — R079 Chest pain, unspecified: Secondary | ICD-10-CM | POA: Diagnosis not present

## 2019-02-09 DIAGNOSIS — I482 Chronic atrial fibrillation, unspecified: Secondary | ICD-10-CM | POA: Diagnosis not present

## 2019-02-09 DIAGNOSIS — M25532 Pain in left wrist: Secondary | ICD-10-CM | POA: Diagnosis not present

## 2019-02-09 DIAGNOSIS — D649 Anemia, unspecified: Secondary | ICD-10-CM | POA: Diagnosis not present

## 2019-02-09 DIAGNOSIS — I5032 Chronic diastolic (congestive) heart failure: Secondary | ICD-10-CM | POA: Diagnosis not present

## 2019-02-09 DIAGNOSIS — Z7901 Long term (current) use of anticoagulants: Secondary | ICD-10-CM | POA: Diagnosis not present

## 2019-02-09 DIAGNOSIS — I10 Essential (primary) hypertension: Secondary | ICD-10-CM | POA: Diagnosis not present

## 2019-02-09 DIAGNOSIS — M25531 Pain in right wrist: Secondary | ICD-10-CM | POA: Diagnosis not present

## 2019-02-09 DIAGNOSIS — I4819 Other persistent atrial fibrillation: Secondary | ICD-10-CM | POA: Diagnosis not present

## 2019-02-09 DIAGNOSIS — R600 Localized edema: Secondary | ICD-10-CM | POA: Diagnosis not present

## 2019-04-20 DIAGNOSIS — I10 Essential (primary) hypertension: Secondary | ICD-10-CM | POA: Diagnosis not present

## 2019-04-20 DIAGNOSIS — I4819 Other persistent atrial fibrillation: Secondary | ICD-10-CM | POA: Diagnosis not present

## 2019-04-20 DIAGNOSIS — R011 Cardiac murmur, unspecified: Secondary | ICD-10-CM | POA: Diagnosis not present

## 2019-10-30 DIAGNOSIS — Z7901 Long term (current) use of anticoagulants: Secondary | ICD-10-CM | POA: Diagnosis not present

## 2019-10-30 DIAGNOSIS — S99921A Unspecified injury of right foot, initial encounter: Secondary | ICD-10-CM | POA: Diagnosis not present

## 2019-10-30 DIAGNOSIS — I10 Essential (primary) hypertension: Secondary | ICD-10-CM | POA: Diagnosis not present

## 2019-10-30 DIAGNOSIS — S9031XA Contusion of right foot, initial encounter: Secondary | ICD-10-CM | POA: Diagnosis not present

## 2019-10-30 DIAGNOSIS — M79671 Pain in right foot: Secondary | ICD-10-CM | POA: Diagnosis not present

## 2019-10-30 DIAGNOSIS — G8911 Acute pain due to trauma: Secondary | ICD-10-CM | POA: Diagnosis not present

## 2019-10-30 DIAGNOSIS — I4891 Unspecified atrial fibrillation: Secondary | ICD-10-CM | POA: Diagnosis not present

## 2019-10-30 DIAGNOSIS — Z79899 Other long term (current) drug therapy: Secondary | ICD-10-CM | POA: Diagnosis not present

## 2019-11-03 DIAGNOSIS — E782 Mixed hyperlipidemia: Secondary | ICD-10-CM | POA: Diagnosis not present

## 2019-11-03 DIAGNOSIS — I10 Essential (primary) hypertension: Secondary | ICD-10-CM | POA: Diagnosis not present

## 2019-11-03 DIAGNOSIS — M1990 Unspecified osteoarthritis, unspecified site: Secondary | ICD-10-CM | POA: Diagnosis not present

## 2019-11-03 DIAGNOSIS — M25561 Pain in right knee: Secondary | ICD-10-CM | POA: Diagnosis not present

## 2019-11-03 DIAGNOSIS — Z1231 Encounter for screening mammogram for malignant neoplasm of breast: Secondary | ICD-10-CM | POA: Diagnosis not present

## 2020-09-21 DIAGNOSIS — R0989 Other specified symptoms and signs involving the circulatory and respiratory systems: Secondary | ICD-10-CM | POA: Diagnosis not present

## 2020-09-21 DIAGNOSIS — J069 Acute upper respiratory infection, unspecified: Secondary | ICD-10-CM | POA: Diagnosis not present

## 2020-09-21 DIAGNOSIS — R519 Headache, unspecified: Secondary | ICD-10-CM | POA: Diagnosis not present

## 2020-09-21 DIAGNOSIS — Z20822 Contact with and (suspected) exposure to covid-19: Secondary | ICD-10-CM | POA: Diagnosis not present

## 2024-03-15 DEATH — deceased
